# Patient Record
Sex: Male | Born: 1960 | Hispanic: Yes | Marital: Married | Smoking: Never smoker
Health system: Southern US, Community
[De-identification: ages and names within clinical notes are randomized; demographics above are authoritative.]

## PROBLEM LIST (undated history)

## (undated) DIAGNOSIS — I219 Acute myocardial infarction, unspecified: Secondary | ICD-10-CM

## (undated) DIAGNOSIS — Z8619 Personal history of other infectious and parasitic diseases: Secondary | ICD-10-CM

## (undated) DIAGNOSIS — T7840XA Allergy, unspecified, initial encounter: Secondary | ICD-10-CM

## (undated) DIAGNOSIS — I2510 Atherosclerotic heart disease of native coronary artery without angina pectoris: Secondary | ICD-10-CM

## (undated) DIAGNOSIS — J45909 Unspecified asthma, uncomplicated: Secondary | ICD-10-CM

## (undated) DIAGNOSIS — I519 Heart disease, unspecified: Secondary | ICD-10-CM

## (undated) DIAGNOSIS — G4730 Sleep apnea, unspecified: Secondary | ICD-10-CM

## (undated) DIAGNOSIS — I10 Essential (primary) hypertension: Secondary | ICD-10-CM

## (undated) DIAGNOSIS — R7303 Prediabetes: Secondary | ICD-10-CM

## (undated) HISTORY — DX: Prediabetes: R73.03

## (undated) HISTORY — DX: Sleep apnea, unspecified: G47.30

## (undated) HISTORY — DX: Essential (primary) hypertension: I10

## (undated) HISTORY — DX: Atherosclerotic heart disease of native coronary artery without angina pectoris: I25.10

## (undated) HISTORY — DX: Heart disease, unspecified: I51.9

## (undated) HISTORY — DX: Allergy, unspecified, initial encounter: T78.40XA

## (undated) HISTORY — DX: Unspecified asthma, uncomplicated: J45.909

## (undated) HISTORY — DX: Personal history of other infectious and parasitic diseases: Z86.19

---

## 2013-07-23 DIAGNOSIS — I219 Acute myocardial infarction, unspecified: Secondary | ICD-10-CM

## 2013-07-23 HISTORY — DX: Acute myocardial infarction, unspecified: I21.9

## 2013-08-06 HISTORY — PX: CORONARY ANGIOPLASTY WITH STENT PLACEMENT: SHX49

## 2014-06-29 ENCOUNTER — Encounter: Payer: Self-pay | Admitting: *Deleted

## 2014-06-29 ENCOUNTER — Emergency Department (HOSPITAL_BASED_OUTPATIENT_CLINIC_OR_DEPARTMENT_OTHER)
Admission: EM | Admit: 2014-06-29 | Discharge: 2014-06-29 | Disposition: A | Discharge status: Home / Self Care | Payer: Federal, State, Local not specified - PPO | Attending: Emergency Medicine | Admitting: Emergency Medicine

## 2014-06-29 ENCOUNTER — Emergency Department
Admission: EM | Admit: 2014-06-29 | Discharge: 2014-06-29 | Disposition: A | Discharge status: Other Acute Inpatient Hospital | Payer: BC Managed Care – PPO | Source: Home / Self Care

## 2014-06-29 ENCOUNTER — Encounter (HOSPITAL_BASED_OUTPATIENT_CLINIC_OR_DEPARTMENT_OTHER): Payer: Self-pay | Admitting: *Deleted

## 2014-06-29 ENCOUNTER — Other Ambulatory Visit: Payer: Self-pay

## 2014-06-29 ENCOUNTER — Emergency Department (HOSPITAL_BASED_OUTPATIENT_CLINIC_OR_DEPARTMENT_OTHER): Payer: Federal, State, Local not specified - PPO

## 2014-06-29 DIAGNOSIS — Z955 Presence of coronary angioplasty implant and graft: Secondary | ICD-10-CM | POA: Insufficient documentation

## 2014-06-29 DIAGNOSIS — Z7982 Long term (current) use of aspirin: Secondary | ICD-10-CM | POA: Diagnosis not present

## 2014-06-29 DIAGNOSIS — I252 Old myocardial infarction: Secondary | ICD-10-CM | POA: Diagnosis not present

## 2014-06-29 DIAGNOSIS — R079 Chest pain, unspecified: Secondary | ICD-10-CM

## 2014-06-29 DIAGNOSIS — Z79899 Other long term (current) drug therapy: Secondary | ICD-10-CM | POA: Diagnosis not present

## 2014-06-29 HISTORY — DX: Acute myocardial infarction, unspecified: I21.9

## 2014-06-29 LAB — COMPREHENSIVE METABOLIC PANEL
ALK PHOS: 83 U/L (ref 39–117)
ALT: 29 U/L (ref 0–53)
AST: 18 U/L (ref 0–37)
Albumin: 3.8 g/dL (ref 3.5–5.2)
Anion gap: 13 (ref 5–15)
BILIRUBIN TOTAL: 0.7 mg/dL (ref 0.3–1.2)
BUN: 19 mg/dL (ref 6–23)
CALCIUM: 9.2 mg/dL (ref 8.4–10.5)
CO2: 24 meq/L (ref 19–32)
Chloride: 105 mEq/L (ref 96–112)
Creatinine, Ser: 0.8 mg/dL (ref 0.50–1.35)
GFR calc non Af Amer: >90 mL/min (ref >90)
Glucose, Bld: 105 mg/dL — ABNORMAL HIGH (ref 70–99)
POTASSIUM: 4.3 meq/L (ref 3.7–5.3)
Sodium: 142 mEq/L (ref 137–147)
Total Protein: 7.8 g/dL (ref 6.0–8.3)

## 2014-06-29 LAB — CBC WITH DIFFERENTIAL/PLATELET
Basophils Absolute: 0 10*3/uL (ref 0.0–0.1)
Basophils Relative: 0 % (ref 0–1)
EOS PCT: 2 % (ref 0–5)
Eosinophils Absolute: 0.2 10*3/uL (ref 0.0–0.7)
HCT: 42 % (ref 39.0–52.0)
Hemoglobin: 14.1 g/dL (ref 13.0–17.0)
Lymphocytes Relative: 32 % (ref 12–46)
Lymphs Abs: 2.2 10*3/uL (ref 0.7–4.0)
MCH: 29.5 pg (ref 26.0–34.0)
MCHC: 33.6 g/dL (ref 30.0–36.0)
MCV: 87.9 fL (ref 78.0–100.0)
Monocytes Absolute: 0.8 10*3/uL (ref 0.1–1.0)
Monocytes Relative: 11 % (ref 3–12)
Neutro Abs: 3.9 10*3/uL (ref 1.7–7.7)
Neutrophils Relative %: 55 % (ref 43–77)
PLATELETS: 198 10*3/uL (ref 150–400)
RBC: 4.78 MIL/uL (ref 4.22–5.81)
RDW: 14.9 % (ref 11.5–15.5)
WBC: 7.1 10*3/uL (ref 4.0–10.5)

## 2014-06-29 LAB — TROPONIN I: Troponin I: <0.3 ng/mL (ref <0.30)

## 2014-06-29 NOTE — ED Provider Notes (Signed)
CSN: [id number]     Arrival date & time 06/29/14   History   First MD Initiated Contact with Patient 06/29/14 1121     Chief Complaint  Patient presents with  . Chest Pain   (Consider location/radiation/quality/duration/timing/severity/associated sxs/prior Treatment) HPI  Pt is a 53 yo male who presents to urgent care with right sided CP intermittently since 06/25/14. Seems to be worse at night. Previous MI 1/15 with stent placement. Moved here from [place] and has appt with cardiologist Dr. Eden Emms on 07/21/14. Denies fever, cough, hx of acid reflux, trauma, or injury. No heavy lifting or new activities.   Past Medical History  Diagnosis Date  . Myocardial infarction 07/2013   Past Surgical History  Procedure Laterality Date  . Coronary angioplasty with stent placement     Family History  Problem Relation Age of Onset  . Hypertension Mother    History  Substance Use Topics  . Smoking status: Never Smoker   . Smokeless tobacco: Not on file  . Alcohol Use: No    Review of Systems  All other systems reviewed and are negative.   Allergies  Review of patient's allergies indicates no known allergies.  Home Medications   Prior to Admission medications   Medication Sig Start Date End Date Taking? Authorizing Provider  aspirin 81 MG tablet Take 81 mg by mouth daily.    Historical Provider, MD  atorvastatin (LIPITOR) 80 MG tablet Take 80 mg by mouth daily.    Historical Provider, MD  carvedilol (COREG) 12.5 MG tablet Take 12.5 mg by mouth 2 (two) times daily with a meal.    Historical Provider, MD  digoxin (LANOXIN) 0.125 MG tablet Take by mouth as needed.    Historical Provider, MD  ticagrelor (BRILINTA) 90 MG TABS tablet Take by mouth 2 (two) times daily.    Historical Provider, MD   BP 118/81 mmHg  Pulse 58  Temp(Src) 99 F (37.2 C) (Oral)  Resp 16  Ht 5\' 9"  (1.753 m)  Wt 223 lb (101.152 kg)  BMI 32.92 kg/m2  SpO2 100% Physical Exam  Constitutional: He is oriented  to person, place, and time. He appears well-developed and well-nourished.  Cardiovascular: Normal rate, regular rhythm and normal heart sounds.   Pulmonary/Chest: Effort normal and breath sounds normal. He has no wheezes.  Neurological: He is alert and oriented to person, place, and time.  Skin: Skin is dry.  Psychiatric: He has a normal mood and affect. His behavior is normal.    ED Course  Procedures (including critical care time) Labs Review  Imaging Review    MDM   1. Chest pain   2. History of MI (myocardial infarction)    EKG was obtained. NSR at 64. No ST elevation noted. Did appear to be RBBB in v1. Rhythm lead t and p waves seemed to merge. No EKG to compare.  Pt appears stable today with good pulse and BP.  With prior hx I would like pt to have cardiac enzymes performed to exclude NSTEMI event. Pt agrees with plan and [facility] is [facility].     Jomarie Longs, PA-C 06/29/14 1457

## 2014-06-29 NOTE — ED Notes (Signed)
Pt c/o center to RT side CP intermittently x 06/25/14, worse at night. He reports having an MI 1/15' with stent placement. He recently moved from [place] and has an appt with a cardiologist Dr Eden Emms on 07/21/14.

## 2014-06-29 NOTE — ED Notes (Signed)
MD [name] at bedside discussing results with family and patient

## 2014-06-29 NOTE — Discharge Instructions (Signed)
Instructed patient to go to ER for further monitoring.

## 2014-06-29 NOTE — ED Notes (Signed)
Pt c/o right chest aching that began 5 days ago. Pain has been intermittent with no other symptoms. Pt had MI with stent placed in Jan., 2015 and sts that this pain is nothing like when he had the MI.

## 2014-06-29 NOTE — ED Provider Notes (Signed)
CSN: [id number]     Arrival date & time 06/29/14  1111 History   First MD Initiated Contact with Patient 06/29/14 1121     Chief Complaint  Patient presents with  . Chest Pain     Patient is a 53 y.o. male presenting with chest pain. The history is provided by the patient.  Chest Pain  Jared Morse presents for evaluation of right sided chest pain.  Pain is descsribed as a numbness feeling.  It is constant and nonradiating.  It has been present for three days.  It ranges from a 1-4 in intensity and does not change with eating, exertion, or breathing.  He denies fevers, cough, SOB, abdominal pain, vomiting, leg swelling or pain.  He has a hx/o MI in January and takes ASA, brilinta, atorvastatin, and carvedilol.  Sxs are different from his prior MI.  Sxs are mild, intermittent, and improving.    Past Medical History  Diagnosis Date  . Myocardial infarction 07/2013   Past Surgical History  Procedure Laterality Date  . Coronary angioplasty with stent placement     Family History  Problem Relation Age of Onset  . Hypertension Mother    History  Substance Use Topics  . Smoking status: Never Smoker   . Smokeless tobacco: Not on file  . Alcohol Use: No    Review of Systems  Cardiovascular: Positive for chest pain.  All other systems reviewed and are negative.     Allergies  Review of patient's allergies indicates no known allergies.  Home Medications   Prior to Admission medications   Medication Sig Start Date End Date Taking? Authorizing Provider  aspirin 81 MG tablet Take 81 mg by mouth daily.    Historical Provider, MD  atorvastatin (LIPITOR) 80 MG tablet Take 80 mg by mouth daily.    Historical Provider, MD  carvedilol (COREG) 12.5 MG tablet Take 12.5 mg by mouth 2 (two) times daily with a meal.    Historical Provider, MD  digoxin (LANOXIN) 0.125 MG tablet Take by mouth as needed.    Historical Provider, MD  ticagrelor (BRILINTA) 90 MG TABS tablet Take by mouth 2 (two) times  daily.    Historical Provider, MD   BP 134/79 mmHg  Pulse 59  Temp(Src) 99 F (37.2 C) (Oral)  Resp 23  Ht 5\' 9"  (1.753 m)  Wt 223 lb (101.152 kg)  BMI 32.92 kg/m2  SpO2 97% Physical Exam  Constitutional: He is oriented to person, place, and time. He appears well-developed and well-nourished.  HENT:  Head: Normocephalic and atraumatic.  Cardiovascular: Normal rate.   No murmur heard. Pulmonary/Chest: Effort normal. No respiratory distress.  Abdominal: Soft. There is no tenderness. There is no rebound and no guarding.  Musculoskeletal: He exhibits no edema or tenderness.  Neurological: He is alert and oriented to person, place, and time.  Skin: Skin is warm and dry.  Psychiatric: He has a normal mood and affect. His behavior is normal.  Nursing note and vitals reviewed.   ED Course  Procedures (including critical care time) Labs Review Labs Reviewed  COMPREHENSIVE METABOLIC PANEL - Abnormal; Notable for the following:    Glucose, Bld 105 (*)    All other components within normal limits  CBC WITH DIFFERENTIAL  TROPONIN I    Imaging Review Dg Chest 2 View  06/29/2014   CLINICAL DATA:  53 year old male with chest pain for 4 days. Initial encounter.  EXAM: CHEST  2 VIEW  COMPARISON:  None.  FINDINGS:  Normal lung volumes. Normal cardiac size and mediastinal contours. Visualized tracheal air column is within normal limits. No pneumothorax, pulmonary edema, pleural effusion or confluent pulmonary opacity. No osseous abnormality identified.  IMPRESSION: Negative, no acute cardiopulmonary abnormality.   Electronically Signed   By: Augusto GambleLee  Hall M.D.   On: 06/29/2014 12:05     EKG Interpretation None       Date: 06/29/2014  Rate: 59  Rhythm: Sinus Bradycardia  QRS Axis: normal  Intervals: normal  ST/T Wave abnormalities: normal  Conduction Disutrbances:incomplete RBBB  Narrative Interpretation:   Old EKG Reviewed: unchanged   MDM   Final diagnoses:  Chest pain  History  of MI (myocardial infarction)    Patient with history of an nonSTEMI back in January of this year in [place] and is here for evaluation of atypical chest pain. He's had chest pain has been present for the last 4 days that is better with exertion, and has no clear trigger. It was obtain EKG and records from [facility] and his EKG has no significant changes from prior. He had a heart cath that demonstrated an RCA lesion which he received a stent for any has mild to moderate LAD disease. Patient is pain-free in the emergency department. Discussed the case with cardiology on call in cardiology recommended offering [facility] for [facility] testing versus outpatient follow-up if pain is resolved and enzymes negative. Patient does not want to come in for stress testing and prefers outpatient follow-up. Discussed with patient calling to see if he can move up his outpatient appointments, continuing his current medications, and returning if he develops recurrent pain. Clinical picture is not consistent with PE, ACS, pneumonia.   Tilden FossaElizabeth Oneka Parada, MD 06/29/14 [phone]

## 2014-06-29 NOTE — ED Notes (Signed)
Records received from [place].

## 2014-06-29 NOTE — Discharge Instructions (Signed)

## 2014-07-20 NOTE — Progress Notes (Signed)
Patient ID: Jared Morse, male   DOB: 02/27/1961, 53 y.o.   MRN: [id number]   53 yo with MI in [place] end of January.  Reviewed cath 08/04/13  Had 90 % proximal RCA lesion after RV branch LAD 50-60% mid after large D1  LV gram inferior MI EF 55%   TC 67  HDL 28  LDL 18  Echo 08/26/13 read as EF 40%  Mild AR no MR   Seen in ER two weeks ago with [facility] R/O thought heart ok ? Melena with anemia started on iron Needs f/u with GI for endo.    Reviewed over 40 pages of records from [facility] including office reports, echo, and cath   Works with [employer]     ROS: Denies fever, malais, weight loss, blurry vision, decreased visual acuity, cough, sputum, SOB, hemoptysis, pleuritic pain, palpitaitons, heartburn, abdominal pain, melena, lower extremity edema, claudication, or rash.  All other systems reviewed and negative   General: Affect appropriate Healthy:  appears stated age HEENT: normal Neck supple with no adenopathy JVP normal no bruits no thyromegaly Lungs clear with no wheezing and good diaphragmatic motion Heart:  S1/S2 no murmur,rub, gallop or click PMI normal Abdomen: benighn, BS positve, no tenderness, no AAA no bruit.  No HSM or HJR Distal pulses intact with no bruits No edema Neuro non-focal Skin warm and dry No muscular weakness  Medications Current Outpatient Prescriptions  Medication Sig Dispense Refill  . aspirin 81 MG tablet Take 81 mg by mouth daily.    Marland Kitchen atorvastatin (LIPITOR) 80 MG tablet Take 80 mg by mouth daily.    . carvedilol (COREG) 12.5 MG tablet Take 12.5 mg by mouth 2 (two) times daily with a meal.    . lisinopril (PRINIVIL,ZESTRIL) 5 MG tablet Take 5 mg by mouth daily. Patient taking 2.5mg  daily.    . ticagrelor (BRILINTA) 90 MG TABS tablet Take by mouth 2 (two) times daily.     No current facility-administered medications for this visit.    Allergies Review of patient's allergies indicates no known  allergies.  Family History: Family History  Problem Relation Age of Onset  . Hypertension Mother     Social History: History   Social History  . Marital Status: Married    Spouse Name: N/A    Number of Children: N/A  . Years of Education: N/A   Occupational History  . Not on file.   Social History Main Topics  . Smoking status: Never Smoker   . Smokeless tobacco: Not on file  . Alcohol Use: No  . Drug Use: No  . Sexual Activity: Not on file   Other Topics Concern  . Not on file   Social History Narrative    Past Surgical History  Procedure Laterality Date  . Coronary angioplasty with stent placement      Past Medical History  Diagnosis Date  . Myocardial infarction 07/2013    Electrocardiogram:  SR rate 59  ICRBBB  12/15  08/20/13 SR RBBB IMI   Today SR rate 64 old IMI QRS 92 ICRBBB  Assessment and Plan

## 2014-07-21 ENCOUNTER — Ambulatory Visit (INDEPENDENT_AMBULATORY_CARE_PROVIDER_SITE_OTHER): Payer: Federal, State, Local not specified - PPO | Admitting: Cardiovascular Disease

## 2014-07-21 ENCOUNTER — Encounter: Payer: Self-pay | Admitting: Cardiovascular Disease

## 2014-07-21 VITALS — BP 122/68 | HR 64 | Ht 69.0 in | Wt 222.1 lb

## 2014-07-21 DIAGNOSIS — R931 Abnormal findings on diagnostic imaging of heart and coronary circulation: Secondary | ICD-10-CM

## 2014-07-21 DIAGNOSIS — R9431 Abnormal electrocardiogram [ECG] [EKG]: Secondary | ICD-10-CM

## 2014-07-21 DIAGNOSIS — I2510 Atherosclerotic heart disease of native coronary artery without angina pectoris: Secondary | ICD-10-CM

## 2014-07-21 DIAGNOSIS — K921 Melena: Secondary | ICD-10-CM

## 2014-07-21 DIAGNOSIS — I209 Angina pectoris, unspecified: Secondary | ICD-10-CM

## 2014-07-21 DIAGNOSIS — I2583 Coronary atherosclerosis due to lipid rich plaque: Principal | ICD-10-CM

## 2014-07-21 NOTE — Assessment & Plan Note (Signed)
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

## 2014-07-21 NOTE — Patient Instructions (Addendum)
Your physician recommends that you schedule a follow-up appointment in:  3 MONTHS WITH  DR  [facility] Your physician has recommended you make the following change in your medication:  STOP  [facility]  Your physician has requested that you have an echocardiogram. Echocardiography is a painless test that uses sound waves to create images of your heart. It provides your doctor with information about the size and shape of your heart and how well your heart's chambers and valves are working. This procedure takes approximately one hour. There are no restrictions for this procedure.  Your physician has requested that you have en exercise stress myoview. For further information please visit [email or web address] Please follow instruction sheet, as given.     [place]   GI MD  DR    Vida Rigger OR DR  Francoise Schaumann [phone]

## 2014-07-21 NOTE — Assessment & Plan Note (Signed)
Has not been relooked at since 2/15  F/u echo no Murmur  Unusual to have remodeling with this low EF and no MR  Echo report did not even mention discrete RWMA

## 2014-07-21 NOTE — Assessment & Plan Note (Signed)
Recent ER visit with [facility]  R/O ECG ok  With ICRBBB and old IMI  F/u stress myovue with known moderate LAD disease Will stop Brillinita had DES a year ago and ? melena

## 2014-07-21 NOTE — Assessment & Plan Note (Signed)
Dark stools subsided  On iron and PPI  Gave him name of Dr Ewing Schlein and Dr Matthias Hughs for referral endo.  DC Brillinta

## 2014-07-26 ENCOUNTER — Ambulatory Visit (HOSPITAL_COMMUNITY): Payer: Federal, State, Local not specified - PPO | Attending: Cardiology

## 2014-07-26 DIAGNOSIS — I2510 Atherosclerotic heart disease of native coronary artery without angina pectoris: Secondary | ICD-10-CM

## 2014-07-26 DIAGNOSIS — I2583 Coronary atherosclerosis due to lipid rich plaque: Secondary | ICD-10-CM

## 2014-07-26 DIAGNOSIS — R931 Abnormal findings on diagnostic imaging of heart and coronary circulation: Secondary | ICD-10-CM

## 2014-07-26 NOTE — Progress Notes (Signed)
2D Echo completed. 07/26/2014 

## 2014-07-27 ENCOUNTER — Ambulatory Visit (HOSPITAL_COMMUNITY): Payer: Federal, State, Local not specified - PPO | Attending: Cardiovascular Disease | Admitting: Radiology

## 2014-07-27 DIAGNOSIS — I4510 Unspecified right bundle-branch block: Secondary | ICD-10-CM | POA: Diagnosis not present

## 2014-07-27 DIAGNOSIS — I2510 Atherosclerotic heart disease of native coronary artery without angina pectoris: Secondary | ICD-10-CM | POA: Diagnosis not present

## 2014-07-27 DIAGNOSIS — I2583 Coronary atherosclerosis due to lipid rich plaque: Secondary | ICD-10-CM

## 2014-07-27 DIAGNOSIS — I10 Essential (primary) hypertension: Secondary | ICD-10-CM | POA: Insufficient documentation

## 2014-07-27 DIAGNOSIS — R931 Abnormal findings on diagnostic imaging of heart and coronary circulation: Secondary | ICD-10-CM

## 2014-07-27 DIAGNOSIS — R079 Chest pain, unspecified: Secondary | ICD-10-CM | POA: Diagnosis not present

## 2014-07-27 DIAGNOSIS — R9431 Abnormal electrocardiogram [ECG] [EKG]: Secondary | ICD-10-CM

## 2014-07-27 DIAGNOSIS — I209 Angina pectoris, unspecified: Secondary | ICD-10-CM

## 2014-07-27 MED ORDER — TECHNETIUM TC 99M SESTAMIBI GENERIC - CARDIOLITE
10.0000 | Freq: Once | INTRAVENOUS | Status: AC | PRN
  Administered 2014-07-27: 10 via INTRAVENOUS

## 2014-07-27 MED ORDER — TECHNETIUM TC 99M SESTAMIBI GENERIC - CARDIOLITE
30.0000 | Freq: Once | INTRAVENOUS | Status: AC | PRN
  Administered 2014-07-27: 30 via INTRAVENOUS

## 2014-07-27 NOTE — Progress Notes (Signed)
[facility] SITE 3 NUCLEAR MED [address] [place], [place] [address] [phone]    Cardiology Nuclear Med Study  Jared Morse is a 54 y.o. male     MRN : [id number]     DOB: 1960/09/29  Procedure Date: 07/27/2014  Nuclear Med Background Indication for Stress Test:  Evaluation for Ischemia, Stent Patency and Abnormal EKG History:  CAD-Stent, 06/29/14 ED R/O MI CP Cardiac Risk Factors: Hypertension and IRBBB  Symptoms:  Chest Pain   Nuclear Pre-Procedure Caffeine/Decaff Intake:  None> 12 hrs NPO After: 5:00pm   Lungs:  clear O2 Sat: 97% on room air. IV 0.9% NS with Angio Cath:  22g  IV Site: R Hand x 1, tolerated well IV Started by:  Irean Hong, RN  Chest Size (in):  48 Cup Size: n/a  Height: 5\' 9"  (1.753 m)  Weight:  225 lb (102.059 kg)  BMI:  Body mass index is 33.21 kg/(m^2). Tech Comments:  Patient held Coreg x 24 hrs. Irean Hong, RN.    Nuclear Med Study 1 or 2 day study: 1 day  Stress Test Type:  Stress  Reading MD: N/A  Order Authorizing Provider:  Charlton Haws, MD  Resting Radionuclide: Technetium 60m Sestamibi  Resting Radionuclide Dose: 11.0 mCi   Stress Radionuclide:  Technetium 74m Sestamibi  Stress Radionuclide Dose: 33.0 mCi           Stress Protocol Rest HR: 56 Stress HR: 166  Rest BP: 115/65 Stress BP: 171/63  Exercise Time (min): 10:30 METS: 12.5   Predicted Max HR: 168 bpm % Max HR: 98.81 bpm Rate Pressure Product: 00923   Dose of Adenosine (mg):  n/a Dose of Lexiscan: n/a mg  Dose of Atropine (mg): n/a Dose of Dobutamine: n/a mcg/kg/min (at max HR)  Stress Test Technologist: Milana Na, EMT-P  Nuclear Technologist:  Kerby Nora, CNMT     Rest Procedure:  Myocardial perfusion imaging was performed at rest 45 minutes following the intravenous administration of Technetium 100m Sestamibi. Rest ECG: NSR - Normal EKG  Stress Procedure:  The patient exercised on the treadmill utilizing the Bruce Protocol for 10:30 minutes.  The patient stopped due to fatigue and denied any chest pain.  Technetium 56m Sestamibi was injected at peak exercise and myocardial perfusion imaging was performed after a brief delay. Stress ECG: No significant ST segment change suggestive of ischemia.  QPS Raw Data Images:  Normal; no motion artifact; normal heart/lung ratio. Stress Images:  Patchy, non-specific defects Rest Images:  Patchy, non-specific defects Subtraction (SDS):  No evidence of ischemia. Transient Ischemic Dilatation (Normal <1.22):  0.86 Lung/Heart Ratio (Normal <0.45):  0.29  Quantitative Gated Spect Images QGS EDV:  141 ml QGS ESV:  72 ml  Impression Exercise Capacity:  Excellent exercise capacity. BP Response:  Normal blood pressure response. Clinical Symptoms:  No significant symptoms noted. ECG Impression:  No significant ST segment change suggestive of ischemia. Comparison with Prior Nuclear Study: No previous nuclear study performed  Overall Impression:  Low risk stress nuclear study without significant perfusion defects and mildly reduced LV systolic function.  LV Ejection Fraction: 49%.  LV Wall Motion:  Normal Wall Motion   Chrystie Nose, MD, [facility] Board Certified in Nuclear Cardiology Attending Cardiologist [facility]

## 2014-08-02 ENCOUNTER — Telehealth: Payer: Self-pay | Admitting: Cardiovascular Disease

## 2014-08-02 NOTE — Telephone Encounter (Signed)
Notified of echo and stress test results. 

## 2014-08-02 NOTE — Telephone Encounter (Signed)
New message      Want stress test/echo  results

## 2015-01-12 ENCOUNTER — Encounter: Payer: Self-pay | Admitting: Family Medicine

## 2015-01-12 ENCOUNTER — Ambulatory Visit (INDEPENDENT_AMBULATORY_CARE_PROVIDER_SITE_OTHER): Payer: Federal, State, Local not specified - PPO | Admitting: Family Medicine

## 2015-01-12 VITALS — BP 130/74 | HR 74 | Ht 69.0 in | Wt 240.0 lb

## 2015-01-12 DIAGNOSIS — M9902 Segmental and somatic dysfunction of thoracic region: Secondary | ICD-10-CM | POA: Diagnosis not present

## 2015-01-12 DIAGNOSIS — M9904 Segmental and somatic dysfunction of sacral region: Secondary | ICD-10-CM | POA: Diagnosis not present

## 2015-01-12 DIAGNOSIS — M999 Biomechanical lesion, unspecified: Secondary | ICD-10-CM

## 2015-01-12 DIAGNOSIS — M549 Dorsalgia, unspecified: Secondary | ICD-10-CM | POA: Insufficient documentation

## 2015-01-12 DIAGNOSIS — M9903 Segmental and somatic dysfunction of lumbar region: Secondary | ICD-10-CM

## 2015-01-12 DIAGNOSIS — M545 Low back pain, unspecified: Secondary | ICD-10-CM

## 2015-01-12 NOTE — Assessment & Plan Note (Signed)
Decision today to treat with OMT was based on Physical Exam  After verbal consent patient was treated with HVLA, ME techniques in her acetabulum all lumbar and sacral areas  Patient tolerated the procedure well with improvement in symptoms  Patient given exercises, stretches and lifestyle modifications  See medications in patient instructions if given  Patient will follow up in 3 weeks

## 2015-01-12 NOTE — Progress Notes (Signed)
Pre visit review using our clinic review tool, if applicable. No additional management support is needed unless otherwise documented below in the visit note. 

## 2015-01-12 NOTE — Progress Notes (Signed)
[name] [address] [place], [place] [address] Phone: [phone] Subjective:    I'm seeing this patient by the request  of:  [place], Timoteo Expose, MD   CC: Back pain  [id number] Traeson Ceci is a 54 y.o. male coming in with complaint of back pain. Patient has had back pain for multiple years. Patient states 5 years ago he did have a flare of back pain and was diagnosed with a bulging disc at L5-S1. Patient did respond very well to conservative therapy including formal physical therapy. Since then he had been doing relatively well. Over the course last 3 weeks though the pain is starting to increase again. Patient started having radiation going down the right leg occasionally but it seems to be significantly better. Patient has been doing the exercises and taking anti-inflammatory's with some mild/moderate benefit. Patient states the pain is a proximally 6 out of 10 in severity. Not stopping him from any activities but he would like to be able to go back to the gym when she has not been doing on a regular basis due to concern that he would irritate the area. Denies any weakness of the lower extremities. Denies any nighttime awakening secondary to pain. Denies any fever, chills, or any abnormal weight loss.  Past Medical History  Diagnosis Date  . Myocardial infarction 07/2013  . Coronary artery disease     consistent with angina/ NON STEMI (non ST elevated myocardial infaraction)  . CAD (coronary artery disease)     EF = 0.40%,mild AR,RVSP=48mmHG  ECHO 08/26/13  . Heart disease    Past Surgical History  Procedure Laterality Date  . Coronary angioplasty with stent placement  08/06/13    DES to RCA in setting of NSTEMI, 50-60 %LAD   History  Substance Use Topics  . Smoking status: Never Smoker   . Smokeless tobacco: Not on file  . Alcohol Use: No   No Known Allergies Family History  Problem Relation Age of Onset  . Hypertension Mother          Past medical history, social, surgical and family history all reviewed in electronic medical record.   Review of Systems: No headache, visual changes, nausea, vomiting, diarrhea, constipation, dizziness, abdominal pain, skin rash, fevers, chills, night sweats, weight loss, swollen lymph nodes, body aches, joint swelling, muscle aches, chest pain, shortness of breath, mood changes.   Objective Blood pressure 130/74, pulse 74, height 5\' 9"  (1.753 m), weight 240 lb (108.863 kg), SpO2 95 %.  General: No apparent distress alert and oriented x3 mood and affect normal, dressed appropriately.  HEENT: Pupils equal, extraocular movements intact  Respiratory: Patient's speak in full sentences and does not appear short of breath  Cardiovascular: No lower extremity edema, non tender, no erythema  Skin: Warm dry intact with no signs of infection or rash on extremities or on axial skeleton.  Abdomen: Soft nontender  Neuro: Cranial nerves II through XII are intact, neurovascularly intact in all extremities with 2+ DTRs and 2+ pulses.  Lymph: No lymphadenopathy of posterior or anterior cervical chain or axillae bilaterally.  Gait normal with good balance and coordination.  MSK:  Non tender with full range of motion and good stability and symmetric strength and tone of shoulders, elbows, wrist, hip, knee and ankles bilaterally.  Back Exam:  Inspection: Unremarkable  Motion: Flexion 45 deg, Extension 45 deg, Side Bending to 45 deg bilaterally,  Rotation to 45 deg bilaterally  SLR laying: Negative  XSLR laying: Negative  Palpable tenderness: Moderate tenderness to palpation of the paraspinal musculature over L4-L5 bilaterally. Mild sacroiliac joint discomfort as well. FABER: negative. Sensory change: Gross sensation intact to all lumbar and sacral dermatomes.  Reflexes: 2+ at both patellar tendons, 2+ at achilles tendons, Babinski's downgoing.  Strength at foot  Plantar-flexion: 5/5 Dorsi-flexion: 5/5  Eversion: 5/5 Inversion: 5/5  Leg strength  Quad: 5/5 Hamstring: 5/5 Hip flexor: 5/5 Hip abductors: 4/5  Gait unremarkable.  Osteopathic findings Thoracic T5 extended rotated and side bent right T7 extended rotated and side bent left Lumbar L2 flexed rotated and side bent right Sacrum Left on left  Procedure note 97110; 15 minutes spent for Therapeutic exercises as stated in above notes.  This included exercises focusing on stretching, strengthening, with significant focus on eccentric aspects.   Low back exercises that included:  Pelvic tilt/bracing instruction to focus on control of the pelvic girdle and lower abdominal muscles  Glute strengthening exercises, focusing on proper firing of the glutes without engaging the low back muscles Proper stretching techniques for maximum relief for the hamstrings, hip flexors, low back and some rotation where tolerated  Proper technique shown and discussed handout in great detail with ATC.  All questions were discussed and answered.     Impression and Recommendations:     This case required medical decision making of moderate complexity.

## 2015-01-12 NOTE — Assessment & Plan Note (Signed)
Do believe the patient's pain is more secondary to a herniated disc previously but now it is more muscle imbalances. Patient given home exercises and work with Event organiser today. We discussed icing regimen and patient did respond well to osteopathic manipulation. We discussed the importance of hip abductor strengthening as well as core strengthening. Patient will start increasing his activity as again. Patient continues to have pain we may need to consider x-rays but do not think that there is necessary at this time. Patient is having no radicular symptoms either. We discussed over-the-counter medications a could be helpful as well. Patient come back in 3 weeks for further evaluation and treatment.

## 2015-01-12 NOTE — Patient Instructions (Signed)
Good to see you and welcome Ice 20 minutes 2 times daily. Usually after activity and before bed. Exercises 3 times a week.  Duexis 3 times a day for 6 days Gym starting Monday and 50% duration and 50% weight increase one or the other by 25% a week See me again in 3 weeks

## 2015-01-26 ENCOUNTER — Telehealth: Payer: Self-pay | Admitting: Family Medicine

## 2015-01-26 MED ORDER — IBUPROFEN-FAMOTIDINE 800-26.6 MG PO TABS: ORAL_TABLET | ORAL | Status: DC

## 2015-01-26 NOTE — Telephone Encounter (Signed)
Spoke to pt, rx for duexis sent into pharmacy.

## 2015-01-26 NOTE — Telephone Encounter (Signed)
Patient need a refill of the samples you gave him, he don't know the name of it.

## 2015-02-02 ENCOUNTER — Ambulatory Visit: Payer: Federal, State, Local not specified - PPO | Admitting: Family Medicine

## 2016-02-13 ENCOUNTER — Telehealth: Payer: Self-pay | Admitting: Emergency Medicine

## 2016-02-13 NOTE — Telephone Encounter (Signed)
Spoke to pt, explained to him we have not seen him in over a year. Pt scheduled OV & will refill med when he comes in.

## 2016-02-13 NOTE — Telephone Encounter (Signed)
Pt called and needs a refill on Ibuprofen-Famotidine 800-26.6 MG TABS. Pharmacy is [facility] in [place]. Please follow up thanks.

## 2016-02-20 ENCOUNTER — Ambulatory Visit (INDEPENDENT_AMBULATORY_CARE_PROVIDER_SITE_OTHER): Payer: Federal, State, Local not specified - PPO | Admitting: Family Medicine

## 2016-02-20 ENCOUNTER — Encounter: Payer: Self-pay | Admitting: Family Medicine

## 2016-02-20 DIAGNOSIS — M545 Low back pain, unspecified: Secondary | ICD-10-CM

## 2016-02-20 MED ORDER — IBUPROFEN-FAMOTIDINE 800-26.6 MG PO TABS: ORAL_TABLET | ORAL | 1 refills | Status: DC

## 2016-02-20 NOTE — Patient Instructions (Signed)
Good to see you  Ice 20 minutes 2 times daily. Usually after activity and before bed. Duexis 3 times daily for 3 days then as needed Roll up towel beneath legs with sitting.  Tennis ball between shoulder blades.  Try to do exercises 2-3 times a week.  [employer] orthotics "total support" online would be great  Vitamin D 2000 IU daily  Turmeric 500mg  daily  See me when you need me.

## 2016-02-20 NOTE — Progress Notes (Signed)
[name] [address] [place], [place] [address] Phone: [phone] Subjective:    I'm seeing this patient by the request  of:  Betty Swaziland, MD   CC: Back pain f/u   NOT:[id number]  Jared Morse is a 55 y.o. male coming in with complaint of back pain. Patient has had back pain for multiple years. Patient states 5 years ago he did have a flare of back pain and was diagnosed with a bulging disc at L5-S1. Patient did respond very well to conservative therapy including formal physical therapy. Patient was seen previously and did have have home exercises given to him. Patient has not been seen for a year. Patient states he is been doing very well. Has responded very well to the do axis. Patient knows he is not supposed to take it on a regular basis secondary to his heart conditions states that the only thing that seems to be beneficial. Patient understands and wants to continue the medication. Patient states otherwise his back is been doing relatively better. Not having the radicular symptoms he was having previously. Not doing exercises regularly and noted that can be helpful.   Past Medical History:  Diagnosis Date  . CAD (coronary artery disease)    EF = 0.40%,mild AR,RVSP=52mmHG  ECHO 08/26/13  . Coronary artery disease    consistent with angina/ NON STEMI (non ST elevated myocardial infaraction)  . Heart disease   . Myocardial infarction [facility]) 07/2013   Past Surgical History:  Procedure Laterality Date  . CORONARY ANGIOPLASTY WITH STENT PLACEMENT  08/06/13   DES to RCA in setting of NSTEMI, 50-60 %LAD   Social History  Substance Use Topics  . Smoking status: Never Smoker  . Smokeless tobacco: Not on file  . Alcohol use No   No Known Allergies Family History  Problem Relation Age of Onset  . Hypertension Mother         Past medical history, social, surgical and family history all reviewed in electronic medical record.   Review of Systems:  No headache, visual changes, nausea, vomiting, diarrhea, constipation, dizziness, abdominal pain, skin rash, fevers, chills, night sweats, weight loss, swollen lymph nodes, body aches, joint swelling, muscle aches, chest pain, shortness of breath, mood changes.   Objective  There were no vitals taken for this visit.  General: No apparent distress alert and oriented x3 mood and affect normal, dressed appropriately.  HEENT: Pupils equal, extraocular movements intact  Respiratory: Patient's speak in full sentences and does not appear short of breath  Cardiovascular: No lower extremity edema, non tender, no erythema  Skin: Warm dry intact with no signs of infection or rash on extremities or on axial skeleton.  Abdomen: Soft nontender  Neuro: Cranial nerves II through XII are intact, neurovascularly intact in all extremities with 2+ DTRs and 2+ pulses.  Lymph: No lymphadenopathy of posterior or anterior cervical chain or axillae bilaterally.  Gait normal with good balance and coordination.  MSK:  Non tender with full range of motion and good stability and symmetric strength and tone of shoulders, elbows, wrist, hip, knee and ankles bilaterally.  Back Exam:  Inspection: Unremarkable  Motion: Flexion 45 deg, Extension 25 deg, Side Bending to 45 deg bilaterally,  Rotation to 45 deg bilaterally  SLR laying: Negative  XSLR laying: Negative  Palpable tenderness: very mild tenderness of the L4-L5 on the right side paraspinal musculature FABER: negative. Sensory change: Gross sensation intact to all lumbar and sacral dermatomes.  Reflexes: 2+ at both patellar tendons, 2+ at achilles tendons, Babinski's downgoing.  Strength at foot  Plantar-flexion: 5/5 Dorsi-flexion: 5/5 Eversion: 5/5 Inversion: 5/5  Leg strength  Quad: 5/5 Hamstring: 5/5 Hip flexor: 5/5 Hip abductors: 4/5  Gait unremarkable.      Impression and Recommendations:     This case required medical decision making of moderate  complexity.

## 2016-02-20 NOTE — Assessment & Plan Note (Signed)
Discussed with patient at great length. Patient has gained significant amount weight. We discussed how this wouldn't affect his cardiovascular disease as well as the back pain. Patient is encouraged to start losing weight. We discussed more non-weightbearing exercises a could be beneficial. We refilled patient's ibuprofen but we warned him of potential side effects. Patient understands the risk and wants to continue the medication. Patient will monitor for any type of bowel or bladder changes or any's type of chest pain. Patient will continue the home exercises in the icing protocol. We discussed proper shoes and given different suggestions of over-the-counter medications. Patient will follow-up with me again in 4 weeks if further changes in her necessary. Patient has declined formal physical therapy recently.  Spent  25 minutes with patient face-to-face and had greater than 50% of counseling including as described above in assessment and plan.

## 2016-03-08 ENCOUNTER — Encounter: Payer: Self-pay | Admitting: Cardiovascular Disease

## 2016-03-20 ENCOUNTER — Encounter: Payer: Self-pay | Admitting: Cardiovascular Disease

## 2016-03-22 ENCOUNTER — Encounter: Payer: Self-pay | Admitting: Cardiovascular Disease

## 2016-04-04 ENCOUNTER — Ambulatory Visit: Payer: Federal, State, Local not specified - PPO | Admitting: Cardiovascular Disease

## 2016-04-04 ENCOUNTER — Telehealth: Payer: Self-pay | Admitting: *Deleted

## 2016-04-04 NOTE — Telephone Encounter (Signed)
LVM about todays appt, told pt they needed to call to reschedule or see when they were to come.

## 2016-05-10 NOTE — Progress Notes (Signed)
Patient ID: Jared Morse, male   DOB: Jun 24, 1961, 55 y.o.   MRN: [id number]   55 y.o.  with MI in [place] end of January 2015 .  Reviewed cath 08/04/13  Had 90 % proximal RCA lesion after RV branch LAD 50-60% mid after large D1  LV gram inferior MI EF 55%   TC 67  HDL 28  LDL 18  Echo 08/26/13 read as EF 40%  Mild AR no MR   Seen in ER 06/2014 with [facility] R/O thought heart ok ? 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  All other systems reviewed and negative   General: Affect appropriate Healthy:  appears stated age HEENT: normal Neck supple with no adenopathy JVP normal no bruits no thyromegaly Lungs clear with no wheezing and good diaphragmatic motion Heart:  S1/S2 no murmur,rub, gallop or click PMI normal Abdomen: benighn, BS positve, no tenderness, no AAA no bruit.  No HSM or HJR Distal pulses intact with no bruits No edema Neuro non-focal Skin warm and dry No muscular weakness  Medications Current Outpatient Prescriptions  Medication Sig Dispense Refill  . aspirin 81 MG tablet Take 81 mg by mouth daily.    Marland Kitchen atorvastatin (LIPITOR) 80 MG tablet Take 1 tablet (80 mg total) by mouth daily. 90 tablet 3  . carvedilol (COREG) 12.5 MG tablet Take 1 tablet (12.5 mg total) by mouth 2 (two) times daily with a meal. 180 tablet 3  .  Ibuprofen-Famotidine 800-26.6 MG TABS Take 1 tablet three times daily as needed. 270 tablet 1   No current facility-administered medications for this visit.     Allergies Review of patient's allergies indicates no known allergies.  Family History: Family History  Problem Relation Age of Onset  . Hypertension Mother     Social History: Social History   Social History  . Marital status: Married    Spouse name: N/A  . Number of children: N/A  . Years of education: N/A   Occupational History  . Not on file.   Social History Main Topics  . Smoking status: Never Smoker  . Smokeless tobacco: Never Used  . Alcohol use No  . Drug use: No  . Sexual activity: Not on file   Other Topics Concern  . Not on file   Social History Narrative  . No narrative on file    Past Surgical History:  Procedure Laterality Date  . CORONARY ANGIOPLASTY WITH STENT PLACEMENT  08/06/13   DES to RCA in setting of NSTEMI, 50-60 %LAD    Past Medical History:  Diagnosis Date  . CAD (coronary artery disease)    EF = 0.40%,mild AR,RVSP=47mmHG  ECHO 08/26/13  . Coronary artery disease    consistent with angina/ NON STEMI (non ST elevated myocardial infaraction)  . Heart disease   . Myocardial infarction 07/2013    Electrocardiogram:  SR  rate 59  ICRBBB  12/15  08/20/13 SR RBBB IMI    07/20/14 SR rate 64 old IMI QRS 92 ICRBBB 05/11/16  SR LAD RBBB Old IMI  Assessment and Plan  CAD: stable no angina continue ASA and beta blocker normal stress test 2016 Cholesterol  On statin labs with primary  Anemia stable labs with primar y MR mild no murmur on exam Fatigue:  Doubt cardiac etiology to get testosterone level checked but told him didn't Think this was issue Life style with frequent travel, lack of exercise weight gain and  Poor sleep habits more likely etiology    Jared HawsPeter Jared Morse Morse

## 2016-05-11 ENCOUNTER — Encounter (INDEPENDENT_AMBULATORY_CARE_PROVIDER_SITE_OTHER): Payer: Self-pay

## 2016-05-11 ENCOUNTER — Encounter: Payer: Self-pay | Admitting: Cardiovascular Disease

## 2016-05-11 ENCOUNTER — Ambulatory Visit (INDEPENDENT_AMBULATORY_CARE_PROVIDER_SITE_OTHER): Payer: Federal, State, Local not specified - PPO | Admitting: Cardiovascular Disease

## 2016-05-11 VITALS — BP 160/72 | HR 65 | Ht 69.0 in | Wt 256.4 lb

## 2016-05-11 DIAGNOSIS — I2510 Atherosclerotic heart disease of native coronary artery without angina pectoris: Secondary | ICD-10-CM

## 2016-05-11 DIAGNOSIS — I2583 Coronary atherosclerosis due to lipid rich plaque: Secondary | ICD-10-CM | POA: Diagnosis not present

## 2016-05-11 MED ORDER — ATORVASTATIN CALCIUM 80 MG PO TABS: 80.0000 mg | ORAL_TABLET | Freq: Every day | ORAL | 3 refills | Status: DC

## 2016-05-11 MED ORDER — CARVEDILOL 12.5 MG PO TABS: 12.5000 mg | ORAL_TABLET | Freq: Two times a day (BID) | ORAL | 3 refills | Status: DC

## 2016-05-11 NOTE — Patient Instructions (Addendum)

## 2016-06-29 IMAGING — CR DG CHEST 2V
2 series · 2 of 2 positions shown · non-contrast
Comparison: None.

CLINICAL DATA: 53-year-old male with chest pain for 4 days. Initial
encounter.

EXAM:
CHEST  2 VIEW

[w chest pa]
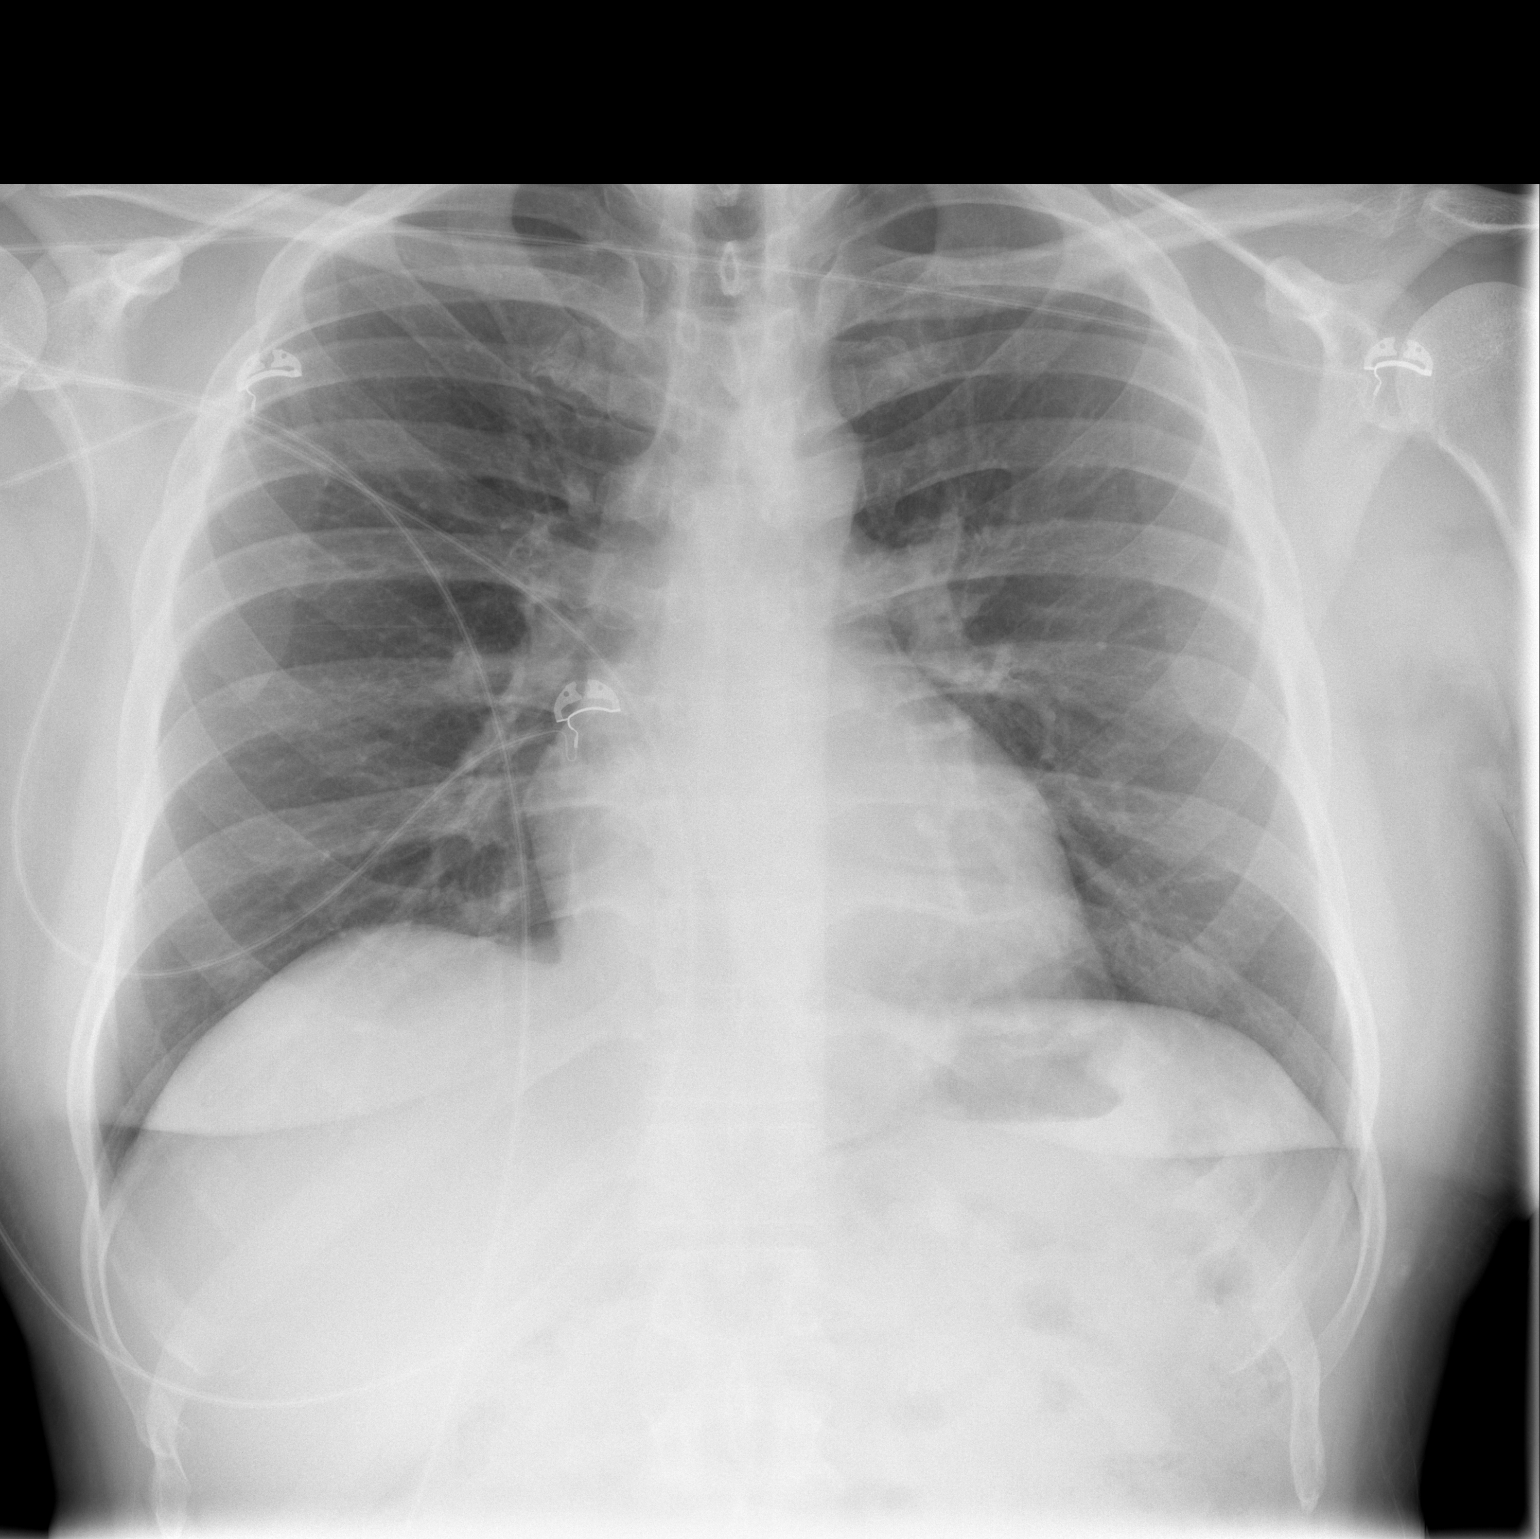

[w chest lat]
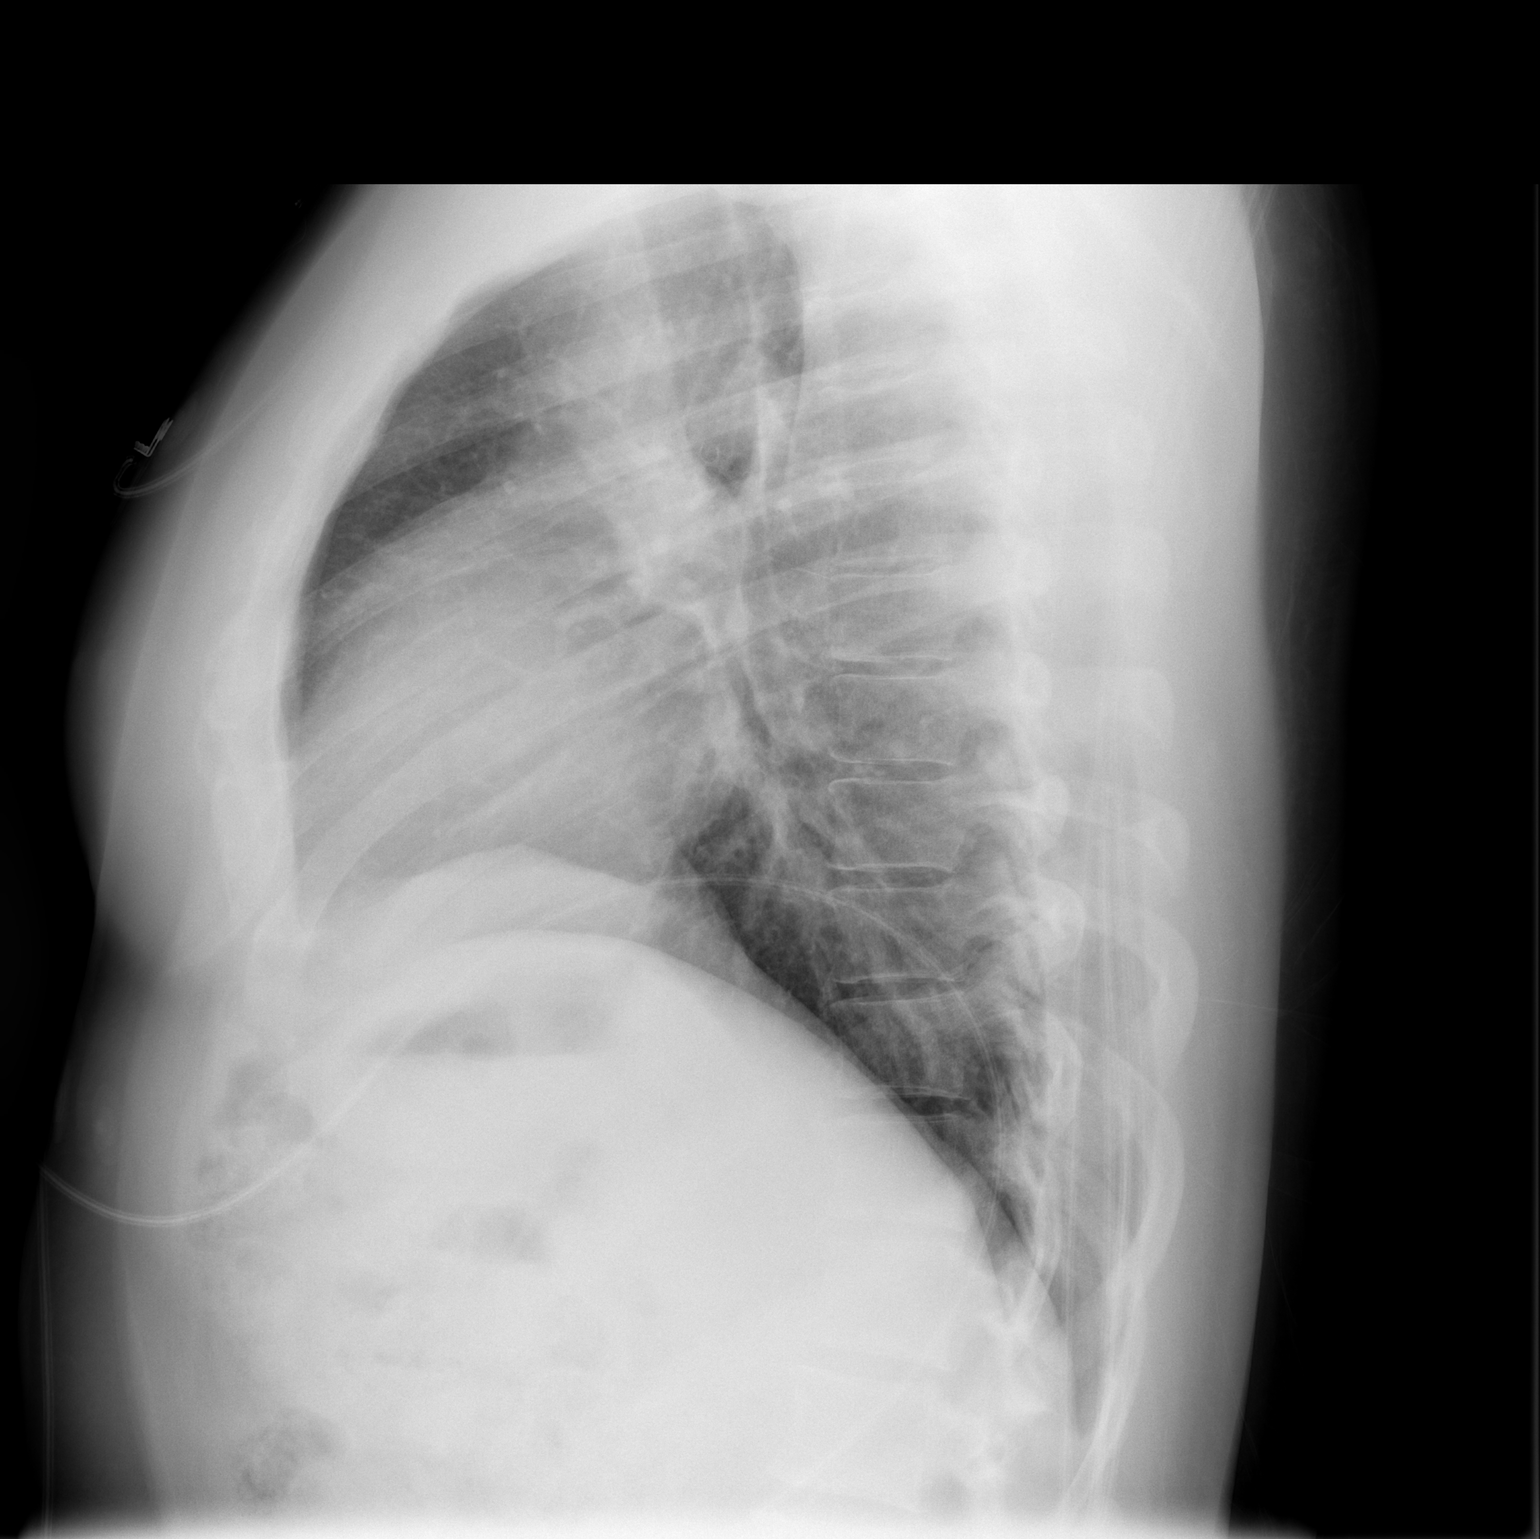

[2 of 2 positions shown; findings below may reference images not displayed]

FINDINGS: Normal lung volumes. Normal cardiac size and mediastinal contours.
Visualized tracheal air column is within normal limits. No
pneumothorax, pulmonary edema, pleural effusion or confluent
pulmonary opacity. No osseous abnormality identified.
IMPRESSION: Negative, no acute cardiopulmonary abnormality.

## 2016-07-04 ENCOUNTER — Other Ambulatory Visit: Payer: Self-pay | Admitting: Cardiovascular Disease

## 2016-07-04 NOTE — Telephone Encounter (Signed)
Medication Detail    Disp Refills Start End   carvedilol (COREG) 12.5 MG tablet 180 tablet 3 05/11/2016    Sig - Route: Take 1 tablet (12.5 mg total) by mouth 2 (two) times daily with a meal. - Oral   E-Prescribing Status: Receipt confirmed by pharmacy (05/11/2016 4:23 PM EDT)   Pharmacy   [facility]- [place], [place] - , [place] - [address]

## 2016-07-12 DIAGNOSIS — E785 Hyperlipidemia, unspecified: Secondary | ICD-10-CM | POA: Diagnosis not present

## 2016-07-12 DIAGNOSIS — Z1211 Encounter for screening for malignant neoplasm of colon: Secondary | ICD-10-CM | POA: Diagnosis not present

## 2016-07-12 DIAGNOSIS — I2510 Atherosclerotic heart disease of native coronary artery without angina pectoris: Secondary | ICD-10-CM | POA: Diagnosis not present

## 2016-07-12 DIAGNOSIS — Z0000 Encounter for general adult medical examination without abnormal findings: Secondary | ICD-10-CM | POA: Diagnosis not present

## 2016-07-24 ENCOUNTER — Telehealth: Payer: Self-pay | Admitting: Cardiovascular Disease

## 2016-07-24 NOTE — Telephone Encounter (Signed)
Adela Glimpse from [facility] is calling on behalf of patient(Jared Morse).  [name] would like Carvedilol and Adela Glimpse is following up. Adela Glimpse states that they don't have necessary paperwork from [place]. Please call at [phone]. Thanks.

## 2016-07-24 NOTE — Telephone Encounter (Signed)
Patient has refills at [facility] pharmacy. I returned call to Adela Glimpse as the message states that [facility] does not have the necessary paperwork?? and per Adela Glimpse he did not ask for any paperwork. He was requesting a refill of the medication. I made him aware of where the refills were and he will call and have them transferred.

## 2016-12-25 DIAGNOSIS — G4733 Obstructive sleep apnea (adult) (pediatric): Secondary | ICD-10-CM | POA: Diagnosis not present

## 2016-12-25 DIAGNOSIS — Z131 Encounter for screening for diabetes mellitus: Secondary | ICD-10-CM | POA: Diagnosis not present

## 2016-12-25 DIAGNOSIS — E785 Hyperlipidemia, unspecified: Secondary | ICD-10-CM | POA: Diagnosis not present

## 2016-12-25 DIAGNOSIS — I2510 Atherosclerotic heart disease of native coronary artery without angina pectoris: Secondary | ICD-10-CM | POA: Diagnosis not present

## 2017-01-14 ENCOUNTER — Ambulatory Visit: Payer: Federal, State, Local not specified - PPO | Admitting: Family Medicine

## 2017-01-16 DIAGNOSIS — D649 Anemia, unspecified: Secondary | ICD-10-CM | POA: Diagnosis not present

## 2017-01-16 DIAGNOSIS — E785 Hyperlipidemia, unspecified: Secondary | ICD-10-CM | POA: Diagnosis not present

## 2017-01-16 DIAGNOSIS — Z131 Encounter for screening for diabetes mellitus: Secondary | ICD-10-CM | POA: Diagnosis not present

## 2017-01-16 DIAGNOSIS — Z6837 Body mass index (BMI) 37.0-37.9, adult: Secondary | ICD-10-CM | POA: Diagnosis not present

## 2017-01-22 ENCOUNTER — Ambulatory Visit (INDEPENDENT_AMBULATORY_CARE_PROVIDER_SITE_OTHER): Payer: Federal, State, Local not specified - PPO | Admitting: Sports Medicine

## 2017-01-22 ENCOUNTER — Encounter: Payer: Self-pay | Admitting: Sports Medicine

## 2017-01-22 VITALS — BP 130/90 | HR 62 | Ht 69.0 in | Wt 262.4 lb

## 2017-01-22 DIAGNOSIS — I2510 Atherosclerotic heart disease of native coronary artery without angina pectoris: Secondary | ICD-10-CM

## 2017-01-22 DIAGNOSIS — M25512 Pain in left shoulder: Secondary | ICD-10-CM | POA: Insufficient documentation

## 2017-01-22 DIAGNOSIS — G8929 Other chronic pain: Secondary | ICD-10-CM

## 2017-01-22 DIAGNOSIS — M545 Low back pain: Secondary | ICD-10-CM

## 2017-01-22 DIAGNOSIS — I2583 Coronary atherosclerosis due to lipid rich plaque: Secondary | ICD-10-CM

## 2017-01-22 DIAGNOSIS — M6289 Other specified disorders of muscle: Secondary | ICD-10-CM

## 2017-01-22 MED ORDER — NAPROXEN-ESOMEPRAZOLE 500-20 MG PO TBEC: 1.0000 | DELAYED_RELEASE_TABLET | Freq: Two times a day (BID) | ORAL | 2 refills | Status: DC

## 2017-01-22 MED ORDER — NAPROXEN-ESOMEPRAZOLE 500-20 MG PO TBEC: 1.0000 | DELAYED_RELEASE_TABLET | Freq: Two times a day (BID) | ORAL | 0 refills | Status: AC

## 2017-01-22 NOTE — Assessment & Plan Note (Signed)
Patient does have markedly tight iliopsoas with isolation.  Hypermobile lumbar spine is likely contributing to overall persistent range of motion but given the symptoms of severe worsening after prolonged sitting and hours long uninterrupted sitting with his occupation with rapid standing this does seem consistent with functional core instability iliopsoas contractures.  We will have him begin with foundations training exercises and emphasis was placed on performing these on a daily basis.
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
## 2017-01-22 NOTE — Patient Instructions (Addendum)
Also check out the YouTube Video from Dr. Myles Lipps.  I would like to see you try performing this 5-6 days per week.   "Powerful Posture and Pain Relief: 12 minutes of Foundation Training" - https://youtu.be/[email or web address]   Do not try to attempt this entire video when first beginning.    Try breaking of each exercise that he goes into shorter segments.  Otherwise if they perform an exercise for 45 seconds, start with 15 seconds and rest and then resume with a begin the new activity.  Work your way up to doing this 12 minute video and I expect to see significant improvements in your pain.   Also related to do  the shoulder exercises.  Please perform the exercise program that we have prepared for you and gone over in detail on a daily basis.  In addition to the handout you were provided you can access your program through: [email or web address]   Your unique program code is: [email or web address]

## 2017-03-14 DIAGNOSIS — M545 Low back pain: Secondary | ICD-10-CM | POA: Diagnosis not present

## 2017-07-04 DIAGNOSIS — R739 Hyperglycemia, unspecified: Secondary | ICD-10-CM | POA: Diagnosis not present

## 2017-07-04 DIAGNOSIS — I10 Essential (primary) hypertension: Secondary | ICD-10-CM | POA: Diagnosis not present

## 2017-07-04 DIAGNOSIS — E785 Hyperlipidemia, unspecified: Secondary | ICD-10-CM | POA: Diagnosis not present

## 2017-07-04 DIAGNOSIS — G4733 Obstructive sleep apnea (adult) (pediatric): Secondary | ICD-10-CM | POA: Diagnosis not present

## 2017-07-04 DIAGNOSIS — R5383 Other fatigue: Secondary | ICD-10-CM | POA: Diagnosis not present

## 2017-07-04 DIAGNOSIS — I2510 Atherosclerotic heart disease of native coronary artery without angina pectoris: Secondary | ICD-10-CM | POA: Diagnosis not present

## 2017-07-12 DIAGNOSIS — G4733 Obstructive sleep apnea (adult) (pediatric): Secondary | ICD-10-CM | POA: Diagnosis not present

## 2017-07-31 DIAGNOSIS — J0100 Acute maxillary sinusitis, unspecified: Secondary | ICD-10-CM | POA: Diagnosis not present

## 2017-08-12 DIAGNOSIS — G4733 Obstructive sleep apnea (adult) (pediatric): Secondary | ICD-10-CM | POA: Diagnosis not present

## 2017-09-04 DIAGNOSIS — R509 Fever, unspecified: Secondary | ICD-10-CM | POA: Diagnosis not present

## 2017-09-04 DIAGNOSIS — J069 Acute upper respiratory infection, unspecified: Secondary | ICD-10-CM | POA: Diagnosis not present

## 2017-09-06 DIAGNOSIS — R9431 Abnormal electrocardiogram [ECG] [EKG]: Secondary | ICD-10-CM | POA: Diagnosis not present

## 2017-09-06 DIAGNOSIS — J101 Influenza due to other identified influenza virus with other respiratory manifestations: Secondary | ICD-10-CM | POA: Diagnosis not present

## 2017-09-06 DIAGNOSIS — J09X2 Influenza due to identified novel influenza A virus with other respiratory manifestations: Secondary | ICD-10-CM | POA: Diagnosis not present

## 2017-09-06 DIAGNOSIS — J4521 Mild intermittent asthma with (acute) exacerbation: Secondary | ICD-10-CM | POA: Diagnosis not present

## 2017-09-06 DIAGNOSIS — R509 Fever, unspecified: Secondary | ICD-10-CM | POA: Diagnosis not present

## 2017-09-06 DIAGNOSIS — I2510 Atherosclerotic heart disease of native coronary artery without angina pectoris: Secondary | ICD-10-CM | POA: Diagnosis not present

## 2017-09-06 DIAGNOSIS — J111 Influenza due to unidentified influenza virus with other respiratory manifestations: Secondary | ICD-10-CM | POA: Diagnosis not present

## 2017-09-06 DIAGNOSIS — R0989 Other specified symptoms and signs involving the circulatory and respiratory systems: Secondary | ICD-10-CM | POA: Diagnosis not present

## 2017-09-06 DIAGNOSIS — R0602 Shortness of breath: Secondary | ICD-10-CM | POA: Diagnosis not present

## 2017-09-10 DIAGNOSIS — J101 Influenza due to other identified influenza virus with other respiratory manifestations: Secondary | ICD-10-CM | POA: Diagnosis not present

## 2017-09-10 DIAGNOSIS — I2510 Atherosclerotic heart disease of native coronary artery without angina pectoris: Secondary | ICD-10-CM | POA: Diagnosis not present

## 2017-09-10 DIAGNOSIS — G4733 Obstructive sleep apnea (adult) (pediatric): Secondary | ICD-10-CM | POA: Diagnosis not present

## 2017-09-10 DIAGNOSIS — J45909 Unspecified asthma, uncomplicated: Secondary | ICD-10-CM | POA: Diagnosis not present

## 2017-09-11 ENCOUNTER — Encounter (HOSPITAL_BASED_OUTPATIENT_CLINIC_OR_DEPARTMENT_OTHER): Payer: Self-pay | Admitting: Emergency Medicine

## 2017-09-11 ENCOUNTER — Inpatient Hospital Stay (HOSPITAL_BASED_OUTPATIENT_CLINIC_OR_DEPARTMENT_OTHER)
Admission: EM | Admit: 2017-09-11 | Discharge: 2017-09-22 | DRG: 193 | Disposition: A | Discharge status: Home / Self Care | Payer: Federal, State, Local not specified - PPO | Attending: Internal Medicine | Admitting: Internal Medicine

## 2017-09-11 ENCOUNTER — Other Ambulatory Visit: Payer: Self-pay

## 2017-09-11 ENCOUNTER — Emergency Department (HOSPITAL_BASED_OUTPATIENT_CLINIC_OR_DEPARTMENT_OTHER): Payer: Federal, State, Local not specified - PPO

## 2017-09-11 DIAGNOSIS — E872 Acidosis: Secondary | ICD-10-CM | POA: Diagnosis present

## 2017-09-11 DIAGNOSIS — J1008 Influenza due to other identified influenza virus with other specified pneumonia: Principal | ICD-10-CM | POA: Diagnosis present

## 2017-09-11 DIAGNOSIS — Z955 Presence of coronary angioplasty implant and graft: Secondary | ICD-10-CM

## 2017-09-11 DIAGNOSIS — J180 Bronchopneumonia, unspecified organism: Secondary | ICD-10-CM | POA: Diagnosis not present

## 2017-09-11 DIAGNOSIS — Z7982 Long term (current) use of aspirin: Secondary | ICD-10-CM | POA: Diagnosis not present

## 2017-09-11 DIAGNOSIS — R001 Bradycardia, unspecified: Secondary | ICD-10-CM | POA: Diagnosis not present

## 2017-09-11 DIAGNOSIS — R197 Diarrhea, unspecified: Secondary | ICD-10-CM | POA: Diagnosis not present

## 2017-09-11 DIAGNOSIS — J189 Pneumonia, unspecified organism: Secondary | ICD-10-CM | POA: Diagnosis not present

## 2017-09-11 DIAGNOSIS — R739 Hyperglycemia, unspecified: Secondary | ICD-10-CM | POA: Diagnosis not present

## 2017-09-11 DIAGNOSIS — J9811 Atelectasis: Secondary | ICD-10-CM | POA: Diagnosis not present

## 2017-09-11 DIAGNOSIS — J101 Influenza due to other identified influenza virus with other respiratory manifestations: Secondary | ICD-10-CM | POA: Diagnosis not present

## 2017-09-11 DIAGNOSIS — Z23 Encounter for immunization: Secondary | ICD-10-CM

## 2017-09-11 DIAGNOSIS — G4733 Obstructive sleep apnea (adult) (pediatric): Secondary | ICD-10-CM | POA: Diagnosis present

## 2017-09-11 DIAGNOSIS — R945 Abnormal results of liver function studies: Secondary | ICD-10-CM

## 2017-09-11 DIAGNOSIS — R740 Nonspecific elevation of levels of transaminase and lactic acid dehydrogenase [LDH]: Secondary | ICD-10-CM | POA: Diagnosis not present

## 2017-09-11 DIAGNOSIS — R7401 Elevation of levels of liver transaminase levels: Secondary | ICD-10-CM

## 2017-09-11 DIAGNOSIS — E875 Hyperkalemia: Secondary | ICD-10-CM | POA: Diagnosis not present

## 2017-09-11 DIAGNOSIS — J9601 Acute respiratory failure with hypoxia: Secondary | ICD-10-CM | POA: Diagnosis not present

## 2017-09-11 DIAGNOSIS — R748 Abnormal levels of other serum enzymes: Secondary | ICD-10-CM | POA: Diagnosis not present

## 2017-09-11 DIAGNOSIS — I10 Essential (primary) hypertension: Secondary | ICD-10-CM | POA: Diagnosis not present

## 2017-09-11 DIAGNOSIS — R778 Other specified abnormalities of plasma proteins: Secondary | ICD-10-CM | POA: Diagnosis present

## 2017-09-11 DIAGNOSIS — R0902 Hypoxemia: Secondary | ICD-10-CM

## 2017-09-11 DIAGNOSIS — K7200 Acute and subacute hepatic failure without coma: Secondary | ICD-10-CM | POA: Diagnosis present

## 2017-09-11 DIAGNOSIS — I252 Old myocardial infarction: Secondary | ICD-10-CM | POA: Diagnosis not present

## 2017-09-11 DIAGNOSIS — I2510 Atherosclerotic heart disease of native coronary artery without angina pectoris: Secondary | ICD-10-CM | POA: Diagnosis not present

## 2017-09-11 DIAGNOSIS — R0602 Shortness of breath: Secondary | ICD-10-CM | POA: Diagnosis not present

## 2017-09-11 DIAGNOSIS — I2583 Coronary atherosclerosis due to lipid rich plaque: Secondary | ICD-10-CM | POA: Diagnosis not present

## 2017-09-11 DIAGNOSIS — J1100 Influenza due to unidentified influenza virus with unspecified type of pneumonia: Secondary | ICD-10-CM | POA: Diagnosis not present

## 2017-09-11 DIAGNOSIS — R7989 Other specified abnormal findings of blood chemistry: Secondary | ICD-10-CM | POA: Diagnosis present

## 2017-09-11 DIAGNOSIS — R918 Other nonspecific abnormal finding of lung field: Secondary | ICD-10-CM | POA: Diagnosis not present

## 2017-09-11 DIAGNOSIS — Z79899 Other long term (current) drug therapy: Secondary | ICD-10-CM

## 2017-09-11 DIAGNOSIS — I472 Ventricular tachycardia, unspecified: Secondary | ICD-10-CM

## 2017-09-11 DIAGNOSIS — J111 Influenza due to unidentified influenza virus with other respiratory manifestations: Secondary | ICD-10-CM | POA: Diagnosis not present

## 2017-09-11 DIAGNOSIS — J188 Other pneumonia, unspecified organism: Secondary | ICD-10-CM

## 2017-09-11 DIAGNOSIS — K760 Fatty (change of) liver, not elsewhere classified: Secondary | ICD-10-CM | POA: Diagnosis present

## 2017-09-11 DIAGNOSIS — R05 Cough: Secondary | ICD-10-CM | POA: Diagnosis not present

## 2017-09-11 DIAGNOSIS — I351 Nonrheumatic aortic (valve) insufficiency: Secondary | ICD-10-CM | POA: Diagnosis not present

## 2017-09-11 LAB — I-STAT VENOUS BLOOD GAS, ED
Acid-base deficit: 1 mmol/L (ref 0.0–2.0)
Bicarbonate: 22.7 mmol/L (ref 20.0–28.0)
O2 Saturation: 72 %
Patient temperature: 98.3
TCO2: 24 mmol/L (ref 22–32)
pCO2, Ven: 35.2 mmHg — ABNORMAL LOW (ref 44.0–60.0)
pH, Ven: 7.416 (ref 7.250–7.430)
pO2, Ven: 36 mmHg (ref 32.0–45.0)

## 2017-09-11 LAB — URINALYSIS, ROUTINE W REFLEX MICROSCOPIC
Bilirubin Urine: NEGATIVE
Glucose, UA: NEGATIVE mg/dL
Hgb urine dipstick: NEGATIVE
Ketones, ur: 15 mg/dL — AB
Leukocytes, UA: NEGATIVE
Nitrite: NEGATIVE
Protein, ur: 100 mg/dL — AB
Specific Gravity, Urine: 1.025 (ref 1.005–1.030)
pH: 6 (ref 5.0–8.0)

## 2017-09-11 LAB — COMPREHENSIVE METABOLIC PANEL
ALT: 142 U/L — ABNORMAL HIGH (ref 17–63)
AST: 119 U/L — ABNORMAL HIGH (ref 15–41)
Albumin: 3 g/dL — ABNORMAL LOW (ref 3.5–5.0)
Alkaline Phosphatase: 75 U/L (ref 38–126)
Anion gap: 10 (ref 5–15)
BUN: 25 mg/dL — ABNORMAL HIGH (ref 6–20)
CO2: 23 mmol/L (ref 22–32)
Calcium: 8.2 mg/dL — ABNORMAL LOW (ref 8.9–10.3)
Chloride: 102 mmol/L (ref 101–111)
Creatinine, Ser: 0.8 mg/dL (ref 0.61–1.24)
GFR calc Af Amer: >60 mL/min (ref >60)
GFR calc non Af Amer: >60 mL/min (ref >60)
Glucose, Bld: 115 mg/dL — ABNORMAL HIGH (ref 65–99)
Potassium: 4.1 mmol/L (ref 3.5–5.1)
Sodium: 135 mmol/L (ref 135–145)
Total Bilirubin: 1.1 mg/dL (ref 0.3–1.2)
Total Protein: 7.1 g/dL (ref 6.5–8.1)

## 2017-09-11 LAB — CBC WITH DIFFERENTIAL/PLATELET
Basophils Absolute: 0 10*3/uL (ref 0.0–0.1)
Basophils Relative: 0 %
Eosinophils Absolute: 0 10*3/uL (ref 0.0–0.7)
Eosinophils Relative: 0 %
HCT: 38.9 % — ABNORMAL LOW (ref 39.0–52.0)
Hemoglobin: 13.6 g/dL (ref 13.0–17.0)
Lymphocytes Relative: 11 %
Lymphs Abs: 1 10*3/uL (ref 0.7–4.0)
MCH: 29.1 pg (ref 26.0–34.0)
MCHC: 35 g/dL (ref 30.0–36.0)
MCV: 83.3 fL (ref 78.0–100.0)
Monocytes Absolute: 0.4 10*3/uL (ref 0.1–1.0)
Monocytes Relative: 5 %
Neutro Abs: 7.3 10*3/uL (ref 1.7–7.7)
Neutrophils Relative %: 84 %
Platelets: 216 10*3/uL (ref 150–400)
RBC: 4.67 MIL/uL (ref 4.22–5.81)
RDW: 14.9 % (ref 11.5–15.5)
WBC: 8.7 10*3/uL (ref 4.0–10.5)

## 2017-09-11 LAB — URINALYSIS, MICROSCOPIC (REFLEX)

## 2017-09-11 LAB — I-STAT CG4 LACTIC ACID, ED
Lactic Acid, Venous: 1.91 mmol/L — ABNORMAL HIGH (ref 0.5–1.9)
Lactic Acid, Venous: 1.63 mmol/L (ref 0.5–1.9)

## 2017-09-11 LAB — ACETAMINOPHEN LEVEL: Acetaminophen (Tylenol), Serum: 10 ug/mL (ref 10–30)

## 2017-09-11 LAB — TROPONIN I: Troponin I: 0.05 ng/mL (ref <0.03)

## 2017-09-11 MED ORDER — ALBUTEROL SULFATE (2.5 MG/3ML) 0.083% IN NEBU
2.5000 mg | INHALATION_SOLUTION | Freq: Four times a day (QID) | RESPIRATORY_TRACT | Status: DC
Start: 2017-09-11 — End: 2017-09-11
  Administered 2017-09-11: 2.5 mg via RESPIRATORY_TRACT
  Filled 2017-09-11: qty 3

## 2017-09-11 MED ORDER — SODIUM CHLORIDE 0.9 % IV SOLN
2000.0000 mg | Freq: Once | INTRAVENOUS | Status: AC
  Administered 2017-09-11: 2000 mg via INTRAVENOUS
  Filled 2017-09-11: qty 2000

## 2017-09-11 MED ORDER — SODIUM CHLORIDE 0.9 % IV BOLUS (SEPSIS)
1000.0000 mL | Freq: Once | INTRAVENOUS | Status: AC
  Administered 2017-09-11: 1000 mL via INTRAVENOUS

## 2017-09-11 MED ORDER — IOPAMIDOL (ISOVUE-300) INJECTION 61%
100.0000 mL | Freq: Once | INTRAVENOUS | Status: AC | PRN
  Administered 2017-09-11: 100 mL via INTRAVENOUS

## 2017-09-11 MED ORDER — ACETYLCYSTEINE 20 % IN SOLN
3.0000 mL | Freq: Once | RESPIRATORY_TRACT | Status: AC
  Administered 2017-09-11: 2 mL via RESPIRATORY_TRACT
  Filled 2017-09-11: qty 30

## 2017-09-11 MED ORDER — KETOROLAC TROMETHAMINE 15 MG/ML IJ SOLN
15.0000 mg | Freq: Once | INTRAMUSCULAR | Status: AC
  Administered 2017-09-11: 15 mg via INTRAVENOUS
  Filled 2017-09-11: qty 1

## 2017-09-11 MED ORDER — SODIUM CHLORIDE 0.9 % IV SOLN
Freq: Once | INTRAVENOUS | Status: AC
  Administered 2017-09-11: 21:00:00 via INTRAVENOUS

## 2017-09-11 MED ORDER — VANCOMYCIN HCL 500 MG IV SOLR
INTRAVENOUS | Status: AC
  Filled 2017-09-11: qty 2000

## 2017-09-11 MED ORDER — SODIUM CHLORIDE 0.9 % IV SOLN
1.0000 g | Freq: Once | INTRAVENOUS | Status: AC
  Administered 2017-09-11: 1 g via INTRAVENOUS
  Filled 2017-09-11: qty 10

## 2017-09-11 MED ORDER — AZITHROMYCIN 500 MG IV SOLR
INTRAVENOUS | Status: AC
  Filled 2017-09-11: qty 500

## 2017-09-11 MED ORDER — AZITHROMYCIN 500 MG IV SOLR
500.0000 mg | Freq: Once | INTRAVENOUS | Status: AC
  Administered 2017-09-11: 500 mg via INTRAVENOUS
  Filled 2017-09-11: qty 500

## 2017-09-11 MED ORDER — VANCOMYCIN HCL IN DEXTROSE 750-5 MG/150ML-% IV SOLN
750.0000 mg | Freq: Two times a day (BID) | INTRAVENOUS | Status: DC
  Administered 2017-09-12: 750 mg via INTRAVENOUS
  Filled 2017-09-11 (×3): qty 150

## 2017-09-11 MED ORDER — ALBUTEROL SULFATE (2.5 MG/3ML) 0.083% IN NEBU
5.0000 mg | INHALATION_SOLUTION | Freq: Once | RESPIRATORY_TRACT | Status: AC
  Administered 2017-09-11: 5 mg via RESPIRATORY_TRACT

## 2017-09-11 MED ORDER — ALBUTEROL SULFATE (2.5 MG/3ML) 0.083% IN NEBU
2.5000 mg | INHALATION_SOLUTION | RESPIRATORY_TRACT | Status: DC
  Administered 2017-09-11: 2.5 mg via RESPIRATORY_TRACT
  Filled 2017-09-11 (×2): qty 3

## 2017-09-11 NOTE — ED Notes (Signed)
ED Provider at bedside discussing plan of care. 

## 2017-09-11 NOTE — ED Notes (Signed)
Patient transported to X-ray 

## 2017-09-11 NOTE — ED Notes (Signed)
Patient transported to CT 

## 2017-09-11 NOTE — ED Notes (Signed)
ED Provider at bedside discussing plan of care for admission. 

## 2017-09-11 NOTE — ED Notes (Signed)
Daughter called and updated on pt's status.

## 2017-09-11 NOTE — Progress Notes (Signed)
RT placed pt on BIPAP, tolerating well at this time. RT will continue to monitor as needed.

## 2017-09-11 NOTE — ED Notes (Signed)
[facility] here for transport. 

## 2017-09-11 NOTE — ED Notes (Signed)
Attempted to call report to 2W, nurse not available for report. My phone number left and unit nurse will return call.

## 2017-09-11 NOTE — ED Triage Notes (Signed)
Pt diagnosed with Flu last week.  Cough getting worse for past few days with increased SOB and CP with cough.

## 2017-09-11 NOTE — Progress Notes (Signed)
Patient is a 57yo male with h/o asthma, CAD presenting with cough.  He was diagnosed with flu 1 week ago and then started on Augmentin for CAP 3 days ago.    CXR shows multifocal PNA post-flu.  Did not receive Tamiflu.  +hypoxia, on 4L [place] O2.  Placed on NRB for persistent hypoxia so no BIPAP for now.  Given Rocephin, Azithro; Vanc added to cover staph from post-flu PNA.  Will admit to SDU.  Jonah Blue, M.D.

## 2017-09-11 NOTE — ED Provider Notes (Addendum)
[facility] EMERGENCY DEPARTMENT Provider Note   CSN: [id number] Arrival date & time: 09/11/17  1110     History   Chief Complaint Chief Complaint  Patient presents with  . Cough    HPI Jared Morse is a 57 y.o. male with history of asthma, coronary artery disease who presents with a 10-day history of cough, fever, shortness of breath, and chest pain with cough.  Patient was diagnosed with flu 1 week ago.  He has had progressive worsening shortness of breath, worse on exertion.  Patient was initiated on Augmentin for suspected pneumonia 3 days ago.  He has also been taking prednisone, Tessalon, albuterol inhaler, Flovent, and DuoNeb nebulizer at home.  Patient reports he has not had a fever in 48 hours.  He has had some loose stools, but no vomiting or abdominal pain.  HPI  Past Medical History:  Diagnosis Date  . CAD (coronary artery disease)    EF = 0.40%,mild AR,RVSP=21mmHG  ECHO 08/26/13  . Coronary artery disease    consistent with angina/ NON STEMI (non ST elevated myocardial infaraction)  . Heart disease   . Myocardial infarction [facility]) 07/2013    Patient Active Problem List   Diagnosis Date Noted  . Multifocal pneumonia 09/11/2017  . Acute pain of left shoulder 01/22/2017  . Back pain 01/12/2015  . Nonallopathic lesion of lumbosacral region 01/12/2015  . Nonallopathic lesion of sacral region 01/12/2015  . Nonallopathic lesion of thoracic region 01/12/2015  . CAD (coronary artery disease) 07/21/2014  . Decreased cardiac ejection fraction 07/21/2014  . Melena 07/21/2014  . Abnormal ECG 07/21/2014    Past Surgical History:  Procedure Laterality Date  . CORONARY ANGIOPLASTY WITH STENT PLACEMENT  08/06/13   DES to RCA in setting of NSTEMI, 50-60 %LAD       Home Medications    Prior to Admission medications   Medication Sig Start Date End Date Taking? Authorizing Provider  atorvastatin (LIPITOR) 80 MG tablet Take 1 tablet (80 mg total) by mouth  daily. 05/11/16  Yes Wendall Stade, MD  carvedilol (COREG) 12.5 MG tablet Take 1 tablet (12.5 mg total) by mouth 2 (two) times daily with a meal. 05/11/16  Yes Wendall Stade, MD  aspirin 81 MG tablet Take 81 mg by mouth daily.    [provider]  Naproxen-Esomeprazole (VIMOVO) 500-20 MG TBEC Take 1 tablet by mouth 2 (two) times daily. 01/22/17   Andrena Mews, DO    Family History Family History  Problem Relation Age of Onset  . Hypertension Mother     Social History Social History   Tobacco Use  . Smoking status: Never Smoker  . Smokeless tobacco: Never Used  Substance Use Topics  . Alcohol use: No  . Drug use: No     Allergies   Patient has no known allergies.   Review of Systems Review of Systems  Constitutional: Negative for chills and fever (none in 48 hours).  HENT: Negative for ear pain, facial swelling and sore throat.   Respiratory: Positive for cough and shortness of breath.   Cardiovascular: Positive for chest pain (with cough only).  Gastrointestinal: Positive for diarrhea. Negative for abdominal pain, blood in stool, nausea and vomiting.  Genitourinary: Negative for dysuria.  Musculoskeletal: Negative for back pain.  Skin: Negative for rash and wound.  Neurological: Negative for headaches.  Psychiatric/Behavioral: The patient is not nervous/anxious.      Physical Exam Updated Vital Signs BP 121/66   Pulse Marland Kitchen)  49   Temp 98.3 F (36.8 C) (Oral)   Resp 20   Ht 5\' 9"  (1.753 m)   Wt 119.3 kg (263 lb 0.1 oz)   SpO2 100%   BMI 38.84 kg/m   Physical Exam  Constitutional: He appears well-developed and well-nourished. No distress.  HENT:  Head: Normocephalic and atraumatic.  Mouth/Throat: Oropharynx is clear and moist. No oropharyngeal exudate.  Eyes: Conjunctivae are normal. Pupils are equal, round, and reactive to light. Right eye exhibits no discharge. Left eye exhibits no discharge. No scleral icterus.  Neck: Normal range of motion.  Neck supple. No thyromegaly present.  Cardiovascular: Regular rhythm, normal heart sounds and intact distal pulses. Bradycardia present. Exam reveals no gallop and no friction rub.  No murmur heard. Pulmonary/Chest: No stridor. Tachypnea noted. No respiratory distress. He has decreased breath sounds. He has no wheezes. He has rales.  Crackles left base Decreased breath sounds throughout Patient 86% on room air on arrival, increased to 94% on 4 L  Abdominal: Soft. Bowel sounds are normal. He exhibits no distension. There is no tenderness. There is no rebound and no guarding.  Musculoskeletal: He exhibits no edema.  Lymphadenopathy:    He has no cervical adenopathy.  Neurological: He is alert. Coordination normal.  Skin: Skin is warm and dry. No rash noted. He is not diaphoretic. No pallor.  Psychiatric: He has a normal mood and affect.  Nursing note and vitals reviewed.    ED Treatments / Results  Labs (all labs ordered are listed, but only abnormal results are displayed) Labs Reviewed  CBC WITH DIFFERENTIAL/PLATELET - Abnormal; Notable for the following components:      Result Value   HCT 38.9 (*)    All other components within normal limits  TROPONIN I - Abnormal; Notable for the following components:   Troponin I 0.05 (*)    All other components within normal limits  COMPREHENSIVE METABOLIC PANEL - Abnormal; Notable for the following components:   Glucose, Bld 115 (*)    BUN 25 (*)    Calcium 8.2 (*)    Albumin 3.0 (*)    AST 119 (*)    ALT 142 (*)    All other components within normal limits  URINALYSIS, ROUTINE W REFLEX MICROSCOPIC - Abnormal; Notable for the following components:   Ketones, ur 15 (*)    Protein, ur 100 (*)    All other components within normal limits  URINALYSIS, MICROSCOPIC (REFLEX) - Abnormal; Notable for the following components:   Bacteria, UA RARE (*)    Squamous Epithelial / LPF 0-5 (*)    All other components within normal limits  I-STAT CG4  LACTIC ACID, ED - Abnormal; Notable for the following components:   Lactic Acid, Venous 1.91 (*)    All other components within normal limits  I-STAT VENOUS BLOOD GAS, ED - Abnormal; Notable for the following components:   pCO2, Ven 35.2 (*)    All other components within normal limits  CULTURE, BLOOD (ROUTINE X 2)  CULTURE, BLOOD (ROUTINE X 2)  ACETAMINOPHEN LEVEL  I-STAT CG4 LACTIC ACID, ED    EKG  EKG Interpretation  Date/Time:  Wednesday September 11 2017 11:27:02 EST Ventricular Rate:  48 PR Interval:    QRS Duration: 108 QT Interval:  474 QTC Calculation: 424 R Axis:   3 Text Interpretation:  Sinus bradycardia RSR' in V1 or V2, right VCD or RVH Baseline wander in lead(s) V1 Peaked TW noted in anterior leads, possible acute T  waves No reciprocal changes No ST elevations Confirmed by Shaune Pollack [phone]) on 09/11/2017 11:52:15 AM       Radiology Dg Chest 2 View  Result Date: 09/11/2017 CLINICAL DATA:  Flu like symptoms for several days.  Dizziness. EXAM: CHEST  2 VIEW COMPARISON:  06/29/2014 FINDINGS: The heart size appears normal. No pleural effusion or edema identified. Numerous nodular opacities are identified throughout both lungs. Findings are suspicious for either metastatic disease or atypical infection. Peripheral area of wedge-shaped consolidation within the right lower lobe identified. IMPRESSION: 1. Diffuse bilateral nodular opacities in both lungs are identified. Cannot rule out pulmonary metastasis versus atypical infection. Further evaluation with contrast enhanced CT of the chest is advised. Electronically Signed   By: Signa Kell M.D.   On: 09/11/2017 12:11   Ct Chest W Contrast  Result Date: 09/11/2017 CLINICAL DATA:  The patient was diagnosed with the flu last week. Worsening cough over the past few days with chest pain and shortness of breath. EXAM: CT CHEST WITH CONTRAST TECHNIQUE: Multidetector CT imaging of the chest was performed during intravenous  contrast administration. CONTRAST:  100 ml ISOVUE-300 IOPAMIDOL (ISOVUE-300) INJECTION 61% COMPARISON:  PA and lateral chest earlier today. FINDINGS: Cardiovascular: No pericardial effusion. Heart size is mildly enlarged. There is calcific coronary artery disease. No calcific aortic atherosclerosis is seen. No aneurysm. Mediastinum/Nodes: Thyroid gland appears normal. No hiatal hernia. Right hilar lymph node on image 56 of series 2 measures 1.6 cm short axis dimension. A small left hilar lymph node is also identified. No axillary lymphadenopathy. Lungs/Pleura: The patient has multifocal airspace disease most consistent with pneumonia. No nodule or mass is identified. No pleural effusion. Upper Abdomen: Low-attenuation of the liver is consistent with fatty infiltration. No acute abnormality in the visualized abdomen is seen. Musculoskeletal: Negative. IMPRESSION: Extensive bilateral airspace disease consistent with multifocal pneumonia. No evidence of neoplastic process is identified. Recommend plain film follow-up to clearing. Mildly prominent right hilar lymph node is presumably reactive. Calcific coronary atherosclerosis. Mild cardiomegaly. Fatty infiltration of the liver. Electronically Signed   By: Drusilla Kanner M.D.   On: 09/11/2017 12:58    Procedures Procedures (including critical care time)  Medications Ordered in ED Medications  azithromycin (ZITHROMAX) 500 MG injection (not administered)  vancomycin (VANCOCIN) 2,000 mg in sodium chloride 0.9 % 500 mL IVPB (2,000 mg Intravenous New Bag/Given 09/11/17 1452)  vancomycin (VANCOCIN) IVPB 750 mg/150 ml premix (not administered)  vancomycin (VANCOCIN) 500 MG powder (not administered)  albuterol (PROVENTIL) (2.5 MG/3ML) 0.083% nebulizer solution 2.5 mg (not administered)  sodium chloride 0.9 % bolus 1,000 mL (0 mLs Intravenous Stopped 09/11/17 1315)  cefTRIAXone (ROCEPHIN) 1 g in sodium chloride 0.9 % 100 mL IVPB (0 g Intravenous Stopped 09/11/17  1400)  azithromycin (ZITHROMAX) 500 mg in sodium chloride 0.9 % 250 mL IVPB (0 mg Intravenous Stopped 09/11/17 1451)  iopamidol (ISOVUE-300) 61 % injection 100 mL (100 mLs Intravenous Contrast Given 09/11/17 1237)     Initial Impression / Assessment and Plan / ED Course  I have reviewed the triage vital signs and the nursing notes.  Pertinent labs & imaging results that were available during my care of the patient were reviewed by me and considered in my medical decision making (see chart for details).     Patient initially hypoxic at 86% on arrive, put on nasal cannula 4 L and brought up to 94-95% on room air.  Patient had another episode of hypoxia on nasal cannula 4 L to 86%.  Patient placed on nonrebreather.  We discussed BiPAP, however patient states he is comfortable with nonrebreather at this time, however made aware that we may need to progress to that.  Patient with multifocal pneumonia post influenza A.  CT shows extensive bilateral airspace disease consistent with multifocal pneumonia, no evidence of neoplastic process, as well as mildly prominent right hilar lymph node presumably reactive.  Chest x-ray showed diffuse bilateral nodular opacities in both lungs and CT of the chest was recommended to rule out pulmonary metastasis.  Elevated lactate initially, which is cleared after 1 L of fluids.  Rocephin and azithromycin initiated, as well as vancomycin, which was added on to cover for possible staph from post influenza pneumonia.  LFTs are elevated, acetaminophen level pending, as patient states he has been taking Tylenol (not more than 4000 mg daily) for fever.  He has no history of alcohol abuse.  Patient does have elevated troponin, 0.05.  EKG showed hyperacute T waves, which did improve on repeat EKG.  Via [facility], Dr. Tresa Endo, cardiologist, advised to considering sepsis, follow sepsis protocol, and have hospitalist consult cardiology if needed.  I was told this after I spoke with  hospitalist, Dr. Ophelia Charter, who agrees to admission at [facility] stepdown unit.  Final Clinical Impressions(s) / ED Diagnoses   Final diagnoses:  Multifocal pneumonia    ED Discharge Orders    None           Emi Holes, PA-C 09/11/17 1640    Shaune Pollack, MD 09/12/17 [phone]

## 2017-09-11 NOTE — ED Notes (Signed)
ED Provider at bedside. 

## 2017-09-11 NOTE — ED Notes (Signed)
Report given to [facility] with [facility]. ETA 30 min. Pt updated.

## 2017-09-11 NOTE — ED Notes (Signed)
Dr. Eudelia Bunch in room with pt. Pt c/o see flashes of light that are consistent with previous migraines. Pt reports mild HA. Order for toradol received and initiated.

## 2017-09-11 NOTE — Progress Notes (Signed)
Pharmacy Antibiotic Note  Jared Morse is a 57 y.o. male admitted on 09/11/2017 with pneumonia.  Pharmacy has been consulted for vancomycin dosing. Received 1x doses of CTX/azithro in the ED today. Had flu 1 week ago. Afebrile, WBC wnl, LA down to 1.63. SCr 0.8 on admit, normalized CrCl~85.  Plan: Vancomycin 2g IV x 1; then g IV qh Monitor clinical progress, c/s, renal function F/u de-escalation plan/LOT, vancomycin trough as indicated   Height: 5\' 9"  (175.3 cm) Weight: 263 lb 0.1 oz (119.3 kg) IBW/kg (Calculated) : 70.7  Temp (24hrs), Avg:98.3 F (36.8 C), Min:98.3 F (36.8 C), Max:98.3 F (36.8 C)  Recent Labs  Lab 09/11/17 1140 09/11/17 1144 09/11/17 1350  WBC 8.7  --   --   CREATININE 0.80  --   --   LATICACIDVEN  --  1.91* 1.63    Estimated Creatinine Clearance: 133 mL/min (by C-G formula based on SCr of 0.8 mg/dL).    No Known Allergies  Babs Bertin, PharmD, BCPS Clinical Pharmacist Clinical phone for 09/11/2017 until 3:30pm: [phone] If after 3:30pm, please call main pharmacy at: [phone] 09/11/2017 2:24 PM

## 2017-09-12 ENCOUNTER — Encounter (HOSPITAL_COMMUNITY): Payer: Self-pay | Admitting: Internal Medicine

## 2017-09-12 ENCOUNTER — Inpatient Hospital Stay (HOSPITAL_COMMUNITY): Payer: Federal, State, Local not specified - PPO

## 2017-09-12 ENCOUNTER — Other Ambulatory Visit: Payer: Self-pay

## 2017-09-12 DIAGNOSIS — R778 Other specified abnormalities of plasma proteins: Secondary | ICD-10-CM | POA: Diagnosis present

## 2017-09-12 DIAGNOSIS — J9601 Acute respiratory failure with hypoxia: Secondary | ICD-10-CM | POA: Diagnosis not present

## 2017-09-12 DIAGNOSIS — R7989 Other specified abnormal findings of blood chemistry: Secondary | ICD-10-CM | POA: Diagnosis present

## 2017-09-12 DIAGNOSIS — J189 Pneumonia, unspecified organism: Secondary | ICD-10-CM

## 2017-09-12 DIAGNOSIS — R748 Abnormal levels of other serum enzymes: Secondary | ICD-10-CM | POA: Diagnosis not present

## 2017-09-12 DIAGNOSIS — G4733 Obstructive sleep apnea (adult) (pediatric): Secondary | ICD-10-CM | POA: Diagnosis not present

## 2017-09-12 DIAGNOSIS — R945 Abnormal results of liver function studies: Secondary | ICD-10-CM

## 2017-09-12 LAB — COMPREHENSIVE METABOLIC PANEL
ALK PHOS: 79 U/L (ref 38–126)
ALT: 127 U/L — ABNORMAL HIGH (ref 17–63)
ALT: 198 U/L — AB (ref 17–63)
AST: 93 U/L — AB (ref 15–41)
AST: 118 U/L — AB (ref 15–41)
Albumin: 2.4 g/dL — ABNORMAL LOW (ref 3.5–5.0)
Albumin: 2.5 g/dL — ABNORMAL LOW (ref 3.5–5.0)
Alkaline Phosphatase: 65 U/L (ref 38–126)
Anion gap: 9 (ref 5–15)
Anion gap: 8 (ref 5–15)
BUN: 21 mg/dL — AB (ref 6–20)
BUN: 15 mg/dL (ref 6–20)
CHLORIDE: 107 mmol/L (ref 101–111)
CHLORIDE: 105 mmol/L (ref 101–111)
CO2: 22 mmol/L (ref 22–32)
CO2: 24 mmol/L (ref 22–32)
CREATININE: 0.84 mg/dL (ref 0.61–1.24)
CREATININE: 0.73 mg/dL (ref 0.61–1.24)
Calcium: 7.7 mg/dL — ABNORMAL LOW (ref 8.9–10.3)
Calcium: 7.7 mg/dL — ABNORMAL LOW (ref 8.9–10.3)
GFR calc Af Amer: >60 mL/min (ref >60)
GFR calc non Af Amer: >60 mL/min (ref >60)
GLUCOSE: 140 mg/dL — AB (ref 65–99)
Glucose, Bld: 110 mg/dL — ABNORMAL HIGH (ref 65–99)
Potassium: 4 mmol/L (ref 3.5–5.1)
Potassium: 4.4 mmol/L (ref 3.5–5.1)
SODIUM: 137 mmol/L (ref 135–145)
Sodium: 138 mmol/L (ref 135–145)
Total Bilirubin: 1.4 mg/dL — ABNORMAL HIGH (ref 0.3–1.2)
Total Bilirubin: 0.8 mg/dL (ref 0.3–1.2)
Total Protein: 6 g/dL — ABNORMAL LOW (ref 6.5–8.1)
Total Protein: 6.1 g/dL — ABNORMAL LOW (ref 6.5–8.1)

## 2017-09-12 LAB — RESPIRATORY PANEL BY PCR
Adenovirus: NOT DETECTED
BORDETELLA PERTUSSIS-RVPCR: NOT DETECTED
Chlamydophila pneumoniae: NOT DETECTED
Coronavirus 229E: NOT DETECTED
Coronavirus HKU1: NOT DETECTED
Coronavirus NL63: NOT DETECTED
Coronavirus OC43: NOT DETECTED
INFLUENZA B-RVPPCR: NOT DETECTED
Influenza A: DETECTED — AB
METAPNEUMOVIRUS-RVPPCR: NOT DETECTED
Mycoplasma pneumoniae: NOT DETECTED
PARAINFLUENZA VIRUS 2-RVPPCR: NOT DETECTED
PARAINFLUENZA VIRUS 3-RVPPCR: NOT DETECTED
Parainfluenza Virus 1: NOT DETECTED
Parainfluenza Virus 4: NOT DETECTED
RESPIRATORY SYNCYTIAL VIRUS-RVPPCR: NOT DETECTED
RHINOVIRUS / ENTEROVIRUS - RVPPCR: NOT DETECTED

## 2017-09-12 LAB — CBC WITH DIFFERENTIAL/PLATELET
Basophils Absolute: 0 10*3/uL (ref 0.0–0.1)
Basophils Relative: 0 %
EOS PCT: 0 %
Eosinophils Absolute: 0 10*3/uL (ref 0.0–0.7)
HCT: 36.6 % — ABNORMAL LOW (ref 39.0–52.0)
Hemoglobin: 12.4 g/dL — ABNORMAL LOW (ref 13.0–17.0)
Lymphocytes Relative: 17 %
Lymphs Abs: 1.2 10*3/uL (ref 0.7–4.0)
MCH: 28.9 pg (ref 26.0–34.0)
MCHC: 33.9 g/dL (ref 30.0–36.0)
MCV: 85.3 fL (ref 78.0–100.0)
MONO ABS: 0.3 10*3/uL (ref 0.1–1.0)
MONOS PCT: 4 %
NEUTROS PCT: 79 %
Neutro Abs: 5.7 10*3/uL (ref 1.7–7.7)
PLATELETS: 209 10*3/uL (ref 150–400)
RBC: 4.29 MIL/uL (ref 4.22–5.81)
RDW: 15.2 % (ref 11.5–15.5)
WBC: 7.2 10*3/uL (ref 4.0–10.5)

## 2017-09-12 LAB — STREP PNEUMONIAE URINARY ANTIGEN: Strep Pneumo Urinary Antigen: NEGATIVE

## 2017-09-12 LAB — TROPONIN I
TROPONIN I: 0.03 ng/mL — AB (ref <0.03)
TROPONIN I: 0.03 ng/mL — AB (ref <0.03)
Troponin I: <0.03 ng/mL (ref <0.03)

## 2017-09-12 LAB — ACETAMINOPHEN LEVEL

## 2017-09-12 LAB — PROTIME-INR
INR: 1.13
Prothrombin Time: 14.4 seconds (ref 11.4–15.2)

## 2017-09-12 LAB — EXPECTORATED SPUTUM ASSESSMENT W GRAM STAIN, RFLX TO RESP C

## 2017-09-12 LAB — GLUCOSE, CAPILLARY: Glucose-Capillary: 166 mg/dL — ABNORMAL HIGH (ref 65–99)

## 2017-09-12 LAB — LACTIC ACID, PLASMA
LACTIC ACID, VENOUS: 1.2 mmol/L (ref 0.5–1.9)
Lactic Acid, Venous: 0.9 mmol/L (ref 0.5–1.9)

## 2017-09-12 LAB — APTT: APTT: 31 s (ref 24–36)

## 2017-09-12 LAB — LACTATE DEHYDROGENASE: LDH: 551 U/L — ABNORMAL HIGH (ref 98–192)

## 2017-09-12 LAB — PROCALCITONIN: Procalcitonin: <0.1 ng/mL

## 2017-09-12 LAB — EXPECTORATED SPUTUM ASSESSMENT W REFEX TO RESP CULTURE

## 2017-09-12 LAB — TSH: TSH: 0.47 u[IU]/mL (ref 0.350–4.500)

## 2017-09-12 LAB — HIV ANTIBODY (ROUTINE TESTING W REFLEX): HIV Screen 4th Generation wRfx: NONREACTIVE

## 2017-09-12 LAB — MRSA PCR SCREENING: MRSA by PCR: NEGATIVE

## 2017-09-12 MED ORDER — ALBUTEROL SULFATE (2.5 MG/3ML) 0.083% IN NEBU: 2.5000 mg | INHALATION_SOLUTION | RESPIRATORY_TRACT | Status: DC | PRN

## 2017-09-12 MED ORDER — HYDROCOD POLST-CPM POLST ER 10-8 MG/5ML PO SUER
5.0000 mL | Freq: Two times a day (BID) | ORAL | Status: DC
  Administered 2017-09-12 – 2017-09-22 (×21): 5 mL via ORAL
  Filled 2017-09-12 (×21): qty 5

## 2017-09-12 MED ORDER — ALBUTEROL SULFATE (2.5 MG/3ML) 0.083% IN NEBU
2.5000 mg | INHALATION_SOLUTION | RESPIRATORY_TRACT | Status: DC
  Administered 2017-09-12 (×2): 2.5 mg via RESPIRATORY_TRACT
  Filled 2017-09-12 (×2): qty 3

## 2017-09-12 MED ORDER — ONDANSETRON HCL 4 MG PO TABS: 4.0000 mg | ORAL_TABLET | Freq: Four times a day (QID) | ORAL | Status: DC | PRN

## 2017-09-12 MED ORDER — ENOXAPARIN SODIUM 40 MG/0.4ML ~~LOC~~ SOLN
40.0000 mg | Freq: Every day | SUBCUTANEOUS | Status: DC
  Administered 2017-09-12 – 2017-09-13 (×2): 40 mg via SUBCUTANEOUS
  Filled 2017-09-12 (×3): qty 0.4

## 2017-09-12 MED ORDER — ASPIRIN EC 81 MG PO TBEC
81.0000 mg | DELAYED_RELEASE_TABLET | Freq: Every day | ORAL | Status: DC
  Administered 2017-09-12 – 2017-09-22 (×11): 81 mg via ORAL
  Filled 2017-09-12 (×11): qty 1

## 2017-09-12 MED ORDER — INFLUENZA VAC SPLIT QUAD 0.5 ML IM SUSY
0.5000 mL | PREFILLED_SYRINGE | INTRAMUSCULAR | Status: AC
  Administered 2017-09-13: 0.5 mL via INTRAMUSCULAR
  Filled 2017-09-12: qty 0.5

## 2017-09-12 MED ORDER — OSELTAMIVIR PHOSPHATE 75 MG PO CAPS
75.0000 mg | ORAL_CAPSULE | Freq: Two times a day (BID) | ORAL | Status: DC
  Filled 2017-09-12: qty 1

## 2017-09-12 MED ORDER — CEFTRIAXONE SODIUM 1 G IJ SOLR
1.0000 g | INTRAMUSCULAR | Status: DC
  Filled 2017-09-12: qty 10

## 2017-09-12 MED ORDER — VANCOMYCIN HCL IN DEXTROSE 750-5 MG/150ML-% IV SOLN
750.0000 mg | Freq: Two times a day (BID) | INTRAVENOUS | Status: DC
  Administered 2017-09-12 – 2017-09-13 (×2): 750 mg via INTRAVENOUS
  Filled 2017-09-12 (×3): qty 150

## 2017-09-12 MED ORDER — METHYLPREDNISOLONE SODIUM SUCC 125 MG IJ SOLR
80.0000 mg | Freq: Three times a day (TID) | INTRAMUSCULAR | Status: DC
  Administered 2017-09-12 – 2017-09-13 (×4): 80 mg via INTRAVENOUS
  Filled 2017-09-12 (×2): qty 2
  Filled 2017-09-12: qty 1.28
  Filled 2017-09-12: qty 2
  Filled 2017-09-12: qty 1.28

## 2017-09-12 MED ORDER — IPRATROPIUM-ALBUTEROL 0.5-2.5 (3) MG/3ML IN SOLN
3.0000 mL | Freq: Four times a day (QID) | RESPIRATORY_TRACT | Status: DC
Start: 2017-09-12 — End: 2017-09-17
  Administered 2017-09-12 – 2017-09-17 (×19): 3 mL via RESPIRATORY_TRACT
  Filled 2017-09-12 (×19): qty 3

## 2017-09-12 MED ORDER — SODIUM CHLORIDE 0.9 % IV SOLN
500.0000 mg | INTRAVENOUS | Status: DC
  Administered 2017-09-12 – 2017-09-19 (×8): 500 mg via INTRAVENOUS
  Filled 2017-09-12 (×11): qty 500

## 2017-09-12 MED ORDER — IPRATROPIUM BROMIDE 0.02 % IN SOLN
0.5000 mg | RESPIRATORY_TRACT | Status: DC
  Administered 2017-09-12 (×2): 0.5 mg via RESPIRATORY_TRACT
  Filled 2017-09-12 (×2): qty 2.5

## 2017-09-12 MED ORDER — ORAL CARE MOUTH RINSE
15.0000 mL | Freq: Two times a day (BID) | OROMUCOSAL | Status: DC
  Administered 2017-09-12 – 2017-09-22 (×14): 15 mL via OROMUCOSAL

## 2017-09-12 MED ORDER — ACETAMINOPHEN 325 MG PO TABS: 650.0000 mg | ORAL_TABLET | Freq: Four times a day (QID) | ORAL | Status: DC | PRN

## 2017-09-12 MED ORDER — ONDANSETRON HCL 4 MG/2ML IJ SOLN: 4.0000 mg | Freq: Four times a day (QID) | INTRAMUSCULAR | Status: DC | PRN

## 2017-09-12 MED ORDER — ENSURE ENLIVE PO LIQD
237.0000 mL | Freq: Two times a day (BID) | ORAL | Status: DC
  Administered 2017-09-13 – 2017-09-17 (×6): 237 mL via ORAL

## 2017-09-12 MED ORDER — ORAL CARE MOUTH RINSE
15.0000 mL | Freq: Two times a day (BID) | OROMUCOSAL | Status: DC
  Administered 2017-09-12 – 2017-09-13 (×4): 15 mL via OROMUCOSAL

## 2017-09-12 MED ORDER — ACETAMINOPHEN 650 MG RE SUPP: 650.0000 mg | Freq: Four times a day (QID) | RECTAL | Status: DC | PRN

## 2017-09-12 MED ORDER — CHLORHEXIDINE GLUCONATE 0.12 % MT SOLN
15.0000 mL | Freq: Two times a day (BID) | OROMUCOSAL | Status: DC
  Administered 2017-09-12 – 2017-09-22 (×18): 15 mL via OROMUCOSAL
  Filled 2017-09-12 (×16): qty 15

## 2017-09-12 MED ORDER — GUAIFENESIN-DM 100-10 MG/5ML PO SYRP
5.0000 mL | ORAL_SOLUTION | ORAL | Status: DC | PRN
  Administered 2017-09-12 – 2017-09-17 (×6): 5 mL via ORAL
  Filled 2017-09-12 (×9): qty 5

## 2017-09-12 MED ORDER — ATORVASTATIN CALCIUM 80 MG PO TABS: 80.0000 mg | ORAL_TABLET | Freq: Every day | ORAL | Status: DC

## 2017-09-12 MED ORDER — LORAZEPAM 2 MG/ML IJ SOLN: 1.0000 mg | Freq: Once | INTRAMUSCULAR | Status: DC

## 2017-09-12 MED ORDER — IPRATROPIUM-ALBUTEROL 0.5-2.5 (3) MG/3ML IN SOLN
3.0000 mL | RESPIRATORY_TRACT | Status: DC
  Administered 2017-09-12: 3 mL via RESPIRATORY_TRACT
  Filled 2017-09-12: qty 3

## 2017-09-12 MED ORDER — CARVEDILOL 12.5 MG PO TABS
12.5000 mg | ORAL_TABLET | Freq: Two times a day (BID) | ORAL | Status: DC
  Administered 2017-09-12 – 2017-09-15 (×7): 12.5 mg via ORAL
  Filled 2017-09-12 (×8): qty 1

## 2017-09-12 MED ORDER — SODIUM CHLORIDE 0.9 % IV SOLN
INTRAVENOUS | Status: AC
  Administered 2017-09-12 (×2): via INTRAVENOUS
  Administered 2017-09-13: 125 mL/h via INTRAVENOUS

## 2017-09-12 MED ORDER — SODIUM CHLORIDE 0.9 % IV SOLN
2.0000 g | INTRAVENOUS | Status: DC
  Administered 2017-09-12 – 2017-09-19 (×8): 2 g via INTRAVENOUS
  Filled 2017-09-12 (×11): qty 20

## 2017-09-12 NOTE — Progress Notes (Signed)
CRITICAL VALUE ALERT  Critical Value: 0.03  Date & Time Notied:  2/21  0421  Provider Notified: Schorr,NP notified Orders Received/Actions taken:

## 2017-09-12 NOTE — Progress Notes (Signed)
Initial Nutrition Assessment  DOCUMENTATION CODES:   Obesity unspecified  INTERVENTION:   Ensure Enlive po BID, each supplement provides 350 kcal and 20 grams of protein  Encouraged pt to order "soft" foods off patient menu until swallowing, respiratory status improves  NUTRITION DIAGNOSIS:   Inadequate oral intake related to poor appetite, acute illness as evidenced by per patient/family report.  GOAL:   Patient will meet greater than or equal to 90% of their needs  MONITOR:   PO intake, Supplement acceptance, Labs, Weight trends  REASON FOR ASSESSMENT:   Malnutrition Screening Tool    ASSESSMENT:   57 yo male admitted with acute respiratory failure with multifocal pneumonia, +flu, fatty liver. Pt with hx of asthma, CAD  Currently on HFNC Diet advanced to Regular this AM but pt did not eat breakfast this AM, reports he "choked" on breakfast. Pt indicates only tolerating soft foods like applesauce, pudding, ice cream, etc at present. Pt reports not eating anything yesterday with poor appetite and intake for 1 week PTA.  Denies weight loss, no weight loss per weight encounters.   Labs: reviewed Meds:  NS at 125 ml/hr, solumedrol  NUTRITION - FOCUSED PHYSICAL EXAM:    Most Recent Value  Orbital Region  No depletion  Upper Arm Region  No depletion  Thoracic and Lumbar Region  No depletion  Buccal Region  No depletion  Temple Region  No depletion  Clavicle Bone Region  No depletion  Clavicle and Acromion Bone Region  No depletion  Scapular Bone Region  No depletion  Dorsal Hand  No depletion  Patellar Region  No depletion  Anterior Thigh Region  No depletion  Posterior Calf Region  No depletion  Edema (RD Assessment)  None       Diet Order:  Diet regular Room service appropriate? Yes; Fluid consistency: Thin  EDUCATION NEEDS:   No education needs have been identified at this time  Skin:  Skin Assessment: Reviewed RN Assessment  Last BM:  2/17  Height:    Ht Readings from Last 1 Encounters:  09/11/17 5\' 9"  (1.753 m)    Weight:   Wt Readings from Last 1 Encounters:  09/11/17 263 lb 10.7 oz (119.6 kg)    Ideal Body Weight:     BMI:  Body mass index is 38.94 kg/m.  Estimated Nutritional Needs:   Kcal:  2000-2200 kcals  Protein:  119-137 g  Fluid:  >/= 2 L   Romelle Starcher MS, RD, LDN, CNSC [phone] Pager  [phone] Weekend/On-Call Pager

## 2017-09-12 NOTE — Progress Notes (Signed)
RT called by RN regarding pt coughing stuff up and choking. Pt taken off of BIPAP and placed on 10L  HFNC and is tolerating well at this time. RT asked pt how pt felt he stated "I feel more comfortable." Pt sats currently 94%. RT will continue to monitor closely.

## 2017-09-12 NOTE — Progress Notes (Signed)
RT assisted in transport of patient and bipap machine from room 2W08 to room 36M06 with no complications. Vitals are stable. RT on 36M has been notified. RT will continue to monitor.

## 2017-09-12 NOTE — Progress Notes (Addendum)
Patient was having increased coughing of sputum,RT was asked to take patient off bipap. Patient is stable and resting comfortably on HFNC.   Patient also refusing Tamiflu this morning until his results from respiratory panel have resulted and has been evaluated in person by PCCM.  Per patient's wife admitting physician called her to inform her that her husband was going to be transferred to ICU overnight and that PCCM would come up and consult. No pending orders for transfer at this time  Will continue to monitor.

## 2017-09-12 NOTE — H&P (Addendum)
History and Physical    Jared Morse ZOX:[id number] DOB: 1960-08-01 DOA: 09/11/2017  PCP: Patient, No Pcp Per  Patient coming from: Home.  Chief Complaint: Shortness of breath cough.  HPI: Jared Morse is a 57 y.o. male with history of CAD status post stenting in 2014 has been experiencing shortness of breath and cough for the last week and a half.  Patient symptoms started on Monday, September 02, 2012 with cough and fever and chills.  Since symptoms persisted patient went to the PCP 2 days later and was given steroids.  As per the wife patient was tested positive for flu.  Since symptoms did not improve patient went to the ER at [facility] on Friday 2 days later, on September 06, 2017 and as per the wife chest x-ray was unremarkable and patient was discharged home.  The following day patient did well but on Sunday patient started getting worsening cough and shortness of breath and primary care physician called in Augmentin.  Patient has been taking Augmentin and prednisone since then.  Prednisone course was over yesterday.  The following day on Monday, September 09, 2017 patient started experiencing low back pain.  Also had a brief episode of retrosternal chest discomfort.  Since patient symptoms got progressively worse patient went to PCP again on February 20 and was prescribed nebulizer treatment.  Despite which patient symptoms do not get improved patient came to the ER.  ED Course: In the ER patient was found to be hypoxic and was placed on nonrebreather.  Labs reveal elevated LFTs.  Troponin was mildly elevated.  EKG was showing hyperacute T waves and cardiologist on call Dr. Tresa Endo was consulted by the ER physician who at this time requested sepsis workup.  Patient eventually underwent CT chest after chest x-ray was showing diffuse bilateral infiltrates.  CT chest shows multifocal pneumonia.  Patient was started on ceftriaxone and Zithromax and vancomycin.  Patient is presently on nonrebreather.  On my  exam patient has diffuse rhonchi and crepitations.  Patient is able to talk but has been persistently coughing.  Denies any chest pain at this time.  Denies any nausea vomiting abdominal pain or diarrhea.  Review of Systems: As per HPI, rest all negative.   Past Medical History:  Diagnosis Date  . CAD (coronary artery disease)    EF = 0.40%,mild AR,RVSP=40mmHG  ECHO 08/26/13  . Coronary artery disease    consistent with angina/ NON STEMI (non ST elevated myocardial infaraction)  . Heart disease   . Myocardial infarction [facility]) 07/2013    Past Surgical History:  Procedure Laterality Date  . CORONARY ANGIOPLASTY WITH STENT PLACEMENT  08/06/13   DES to RCA in setting of NSTEMI, 50-60 %LAD     reports that  has never smoked. he has never used smokeless tobacco. He reports that he does not drink alcohol or use drugs.  No Known Allergies  Family History  Problem Relation Age of Onset  . Hypertension Mother     Prior to Admission medications   Medication Sig Start Date End Date Taking? Authorizing Provider  atorvastatin (LIPITOR) 80 MG tablet Take 1 tablet (80 mg total) by mouth daily. 05/11/16  Yes Wendall Stade, MD  carvedilol (COREG) 12.5 MG tablet Take 1 tablet (12.5 mg total) by mouth 2 (two) times daily with a meal. 05/11/16  Yes Wendall Stade, MD  aspirin 81 MG tablet Take 81 mg by mouth daily.    [provider]  Naproxen-Esomeprazole (VIMOVO) 500-20 MG  TBEC Take 1 tablet by mouth 2 (two) times daily. 01/22/17   Andrena Mews, DO    Physical Exam: Vitals:   09/11/17 2000 09/11/17 2030 09/11/17 2238 09/11/17 2347  BP: 113/66 118/65 137/76   Pulse: (!) 55 (!) 59    Resp: 18 20    Temp:   98.6 F (37 C)   TempSrc:   Axillary   SpO2: 96% 97%  97%  Weight:   119.6 kg (263 lb 10.7 oz)   Height:   5\' 9"  (1.753 m)       Constitutional: Moderately built and nourished. Vitals:   09/11/17 2000 09/11/17 2030 09/11/17 2238 09/11/17 2347  BP: 113/66 118/65  137/76   Pulse: (!) 55 (!) 59    Resp: 18 20    Temp:   98.6 F (37 C)   TempSrc:   Axillary   SpO2: 96% 97%  97%  Weight:   119.6 kg (263 lb 10.7 oz)   Height:   5\' 9"  (1.753 m)    Eyes: Anicteric no pallor. ENMT: No discharge from the ears eyes nose or mouth. Neck: No JVD appreciated no mass felt. Respiratory: Bilateral diffuse rhonchi and crepitations. Cardiovascular: S1-S2 heard no murmurs appreciated. Abdomen: Soft nontender bowel sounds present. Musculoskeletal: No edema.  No joint effusion. Skin: No rash.  Skin appears warm. Neurologic: Alert awake oriented to time place and person.  Moves all extremities. Psychiatric: Appears normal.  Normal affect.   Labs on Admission: I have personally reviewed following labs and imaging studies  CBC: Recent Labs  Lab 09/11/17 1140  WBC 8.7  NEUTROABS 7.3  HGB 13.6  HCT 38.9*  MCV 83.3  PLT 216   Basic Metabolic Panel: Recent Labs  Lab 09/11/17 1140  NA 135  K 4.1  CL 102  CO2 23  GLUCOSE 115*  BUN 25*  CREATININE 0.80  CALCIUM 8.2*   GFR: Estimated Creatinine Clearance: 133.3 mL/min (by C-G formula based on SCr of 0.8 mg/dL). Liver Function Tests: Recent Labs  Lab 09/11/17 1140  AST 119*  ALT 142*  ALKPHOS 75  BILITOT 1.1  PROT 7.1  ALBUMIN 3.0*   No results for input(s): LIPASE, AMYLASE in the last 168 hours. No results for input(s): AMMONIA in the last 168 hours. Coagulation Profile: No results for input(s): INR, PROTIME in the last 168 hours. Cardiac Enzymes: Recent Labs  Lab 09/11/17 1140  TROPONINI 0.05*   BNP (last 3 results) No results for input(s): PROBNP in the last 8760 hours. HbA1C: No results for input(s): HGBA1C in the last 72 hours. CBG: No results for input(s): GLUCAP in the last 168 hours. Lipid Profile: No results for input(s): CHOL, HDL, LDLCALC, TRIG, CHOLHDL, LDLDIRECT in the last 72 hours. Thyroid Function Tests: No results for input(s): TSH, T4TOTAL, FREET4, T3FREE,  THYROIDAB in the last 72 hours. Anemia Panel: No results for input(s): VITAMINB12, FOLATE, FERRITIN, TIBC, IRON, RETICCTPCT in the last 72 hours. Urine analysis:    Component Value Date/Time   COLORURINE YELLOW 09/11/2017 1312   APPEARANCEUR CLEAR 09/11/2017 1312   LABSPEC 1.025 09/11/2017 1312   PHURINE 6.0 09/11/2017 1312   GLUCOSEU NEGATIVE 09/11/2017 1312   HGBUR NEGATIVE 09/11/2017 1312   BILIRUBINUR NEGATIVE 09/11/2017 1312   KETONESUR 15 (A) 09/11/2017 1312   PROTEINUR 100 (A) 09/11/2017 1312   NITRITE NEGATIVE 09/11/2017 1312   LEUKOCYTESUR NEGATIVE 09/11/2017 1312   Sepsis Labs: @LABRCNTIP (procalcitonin:4,lacticidven:4) )No results found for this or any previous visit (from the past  240 hour(s)).   Radiological Exams on Admission: Dg Chest 2 View  Result Date: 09/11/2017 CLINICAL DATA:  Flu like symptoms for several days.  Dizziness. EXAM: CHEST  2 VIEW COMPARISON:  06/29/2014 FINDINGS: The heart size appears normal. No pleural effusion or edema identified. Numerous nodular opacities are identified throughout both lungs. Findings are suspicious for either metastatic disease or atypical infection. Peripheral area of wedge-shaped consolidation within the right lower lobe identified. IMPRESSION: 1. Diffuse bilateral nodular opacities in both lungs are identified. Cannot rule out pulmonary metastasis versus atypical infection. Further evaluation with contrast enhanced CT of the chest is advised. Electronically Signed   By: Signa Kell M.D.   On: 09/11/2017 12:11   Ct Chest W Contrast  Result Date: 09/11/2017 CLINICAL DATA:  The patient was diagnosed with the flu last week. Worsening cough over the past few days with chest pain and shortness of breath. EXAM: CT CHEST WITH CONTRAST TECHNIQUE: Multidetector CT imaging of the chest was performed during intravenous contrast administration. CONTRAST:  100 ml ISOVUE-300 IOPAMIDOL (ISOVUE-300) INJECTION 61% COMPARISON:  PA and lateral  chest earlier today. FINDINGS: Cardiovascular: No pericardial effusion. Heart size is mildly enlarged. There is calcific coronary artery disease. No calcific aortic atherosclerosis is seen. No aneurysm. Mediastinum/Nodes: Thyroid gland appears normal. No hiatal hernia. Right hilar lymph node on image 56 of series 2 measures 1.6 cm short axis dimension. A small left hilar lymph node is also identified. No axillary lymphadenopathy. Lungs/Pleura: The patient has multifocal airspace disease most consistent with pneumonia. No nodule or mass is identified. No pleural effusion. Upper Abdomen: Low-attenuation of the liver is consistent with fatty infiltration. No acute abnormality in the visualized abdomen is seen. Musculoskeletal: Negative. IMPRESSION: Extensive bilateral airspace disease consistent with multifocal pneumonia. No evidence of neoplastic process is identified. Recommend plain film follow-up to clearing. Mildly prominent right hilar lymph node is presumably reactive. Calcific coronary atherosclerosis. Mild cardiomegaly. Fatty infiltration of the liver. Electronically Signed   By: Drusilla Kanner M.D.   On: 09/11/2017 12:58    EKG: Independently reviewed.  Normal sinus rhythm with hyperacute T waves in the anterior leads.  Assessment/Plan Principal Problem:   Acute respiratory failure with hypoxia (HCC) Active Problems:   CAD (coronary artery disease)   Multifocal pneumonia   Elevated LFTs   Elevated troponin    1. Acute respiratory failure with hypoxia likely secondary to multifocal pneumonia/ARDS -patient is on vancomycin and ceftriaxone and Zithromax for pneumonia.  Will check respiratory viral panel, check urine for Legionella strep antigen blood cultures sputum cultures HIV status.  Continue on BiPAP.  I have also place patient on nebulizer. I am going to consult pulmonary critical care.  Patient will be kept n.p.o. except medications. 2. Elevated LFTs with acetaminophen level around 10.   Will discuss with pulmonary critical care and also poison control.  Check INR and follow LFTs.  Avoid acetaminophen for now.  Hold statins. 3. Elevated troponin with hyperacute T waves and history of CAD -had chest pain 3 days ago.  Presently chest pain-free.  We will cycle cardiac markers aspirin check INR check 2D echo.  Will get cardiology consult.  Hold statins due to elevated LFTs.   Addendum -discussed with pulmonary critical care Dr. Nicholos Johns who advised patient to be placed on Solu-Medrol 80 mg IV every 8 and DuoNeb and start Tamiflu.  They will be seeing patient in consult.   DVT prophylaxis: Lovenox if INR is normal. Code Status: Full code. Family Communication: Patient's  wife. Disposition Plan: Home. Consults called: Pulmonary critical care and cardiology. Admission status: Inpatient.   Eduard Clos MD [facility] Pager [phone].  If 7PM-7AM, please contact night-coverage [email or web address] Password [facility]  09/12/2017, 12:39 AM

## 2017-09-12 NOTE — Consult Note (Signed)
PULMONARY / CRITICAL CARE MEDICINE   Name: Jared Morse MRN: [id number] DOB: 20-May-1961    ADMISSION DATE:  09/11/2017 CONSULTATION DATE: 09/12/2017  REFERRING MD:  [facility]  CHIEF COMPLAINT:  Chest pain dry cough  HISTORY OF PRESENT ILLNESS:    Jared Morse is a 57 year old male with a remote history of smoking but does have a secondhand smoking experience.  He does have history of asthma.  He he reports a 10-day history of increasing malaise, intermittent fevers, dry nonproductive cough.  He does note chest pain and does have a history of having a stent in 2014 but he says this is not usual chest pain. He went to his primary care physician and was given steroids and was reported to have a positive flu but was too far out for treatment with Tamiflu. Despite treatment he continued to worsen especially when his steroids were completed.  He went to the emergency room at [facility] and was treated with a nebulizer treatment he presented to the emergency room on 4/20 after being seen by his primary care physician and CT scan revealed extensive bilateral airspace disease. He was briefly placed on BiPAP but did not tolerate well but does not appear to be in acute respiratory distress at the time of this examination.  He is obviously weak and some mild disorientation. Pulmonary critical care asked to evaluate.  He is on broad-spectrum antibiotics at this time, nasal cannula with O2 sats 92%.  He is sitting up in the bed and is mildly confused.  His wife answers most of his questions for him but he does not seem to be short of breath with talking. To the fact he is proven refractory to treatment and has been in respiratory distress think the prudent thing to do was transferred to ICU for further evaluation and treatment on 09/12/2017 PAST MEDICAL HISTORY :  He  has a past medical history of CAD (coronary artery disease), Coronary artery disease, Heart disease, and Myocardial infarction (HCC) (07/2013).  PAST  SURGICAL HISTORY: He  has a past surgical history that includes Coronary angioplasty with stent (08/06/13).  No Known Allergies  No current facility-administered medications on file prior to encounter.    Current Outpatient Medications on File Prior to Encounter  Medication Sig  . acetaminophen (TYLENOL) 325 MG tablet Take 650 mg by mouth every 6 (six) hours as needed for mild pain.  Marland Kitchen albuterol (PROVENTIL) (2.5 MG/3ML) 0.083% nebulizer solution Take 3 mLs by nebulization 3 (three) times daily as needed for wheezing.  Marland Kitchen amoxicillin-clavulanate (AUGMENTIN) 875-125 MG tablet Take 1 tablet by mouth 2 (two) times daily.  Marland Kitchen aspirin 81 MG tablet Take 81 mg by mouth daily.  Marland Kitchen atorvastatin (LIPITOR) 80 MG tablet Take 1 tablet (80 mg total) by mouth daily. (Patient taking differently: Take 80 mg by mouth daily at 6 PM. )  . benzonatate (TESSALON) 100 MG capsule Take 100 mg by mouth 3 (three) times daily.  . carvedilol (COREG) 12.5 MG tablet Take 1 tablet (12.5 mg total) by mouth 2 (two) times daily with a meal.  . FLOVENT HFA 110 MCG/ACT inhaler Inhale 1 puff into the lungs 2 (two) times daily.  Marland Kitchen HYDROcodone-homatropine (HYCODAN) 5-1.5 MG/5ML syrup Take 5 mLs by mouth at bedtime as needed for cough. May suppress respiratory drive  . ibuprofen (ADVIL,MOTRIN) 200 MG tablet Take 200 mg by mouth every 6 (six) hours as needed for moderate pain.  Marland Kitchen ipratropium-albuterol (DUONEB) 0.5-2.5 (3) MG/3ML SOLN Take 3 mLs by nebulization  every 8 (eight) hours as needed for wheezing. X 15 days starting 2/19  . nitroGLYCERIN (NITROSTAT) 0.4 MG SL tablet Place 0.4 mg under the tongue every 5 (five) minutes as needed for chest pain.  . predniSONE (STERAPRED UNI-PAK 48 TAB) 10 MG (48) TBPK tablet Take 10 mg by mouth as directed. As directed by physician  . PROAIR HFA 108 (90 Base) MCG/ACT inhaler Inhale 1 puff into the lungs every 6 (six) hours as needed for wheezing.  . fluticasone (FLONASE) 50 MCG/ACT nasal spray Place  2 sprays into both nostrils daily.    FAMILY HISTORY:  His indicated that his mother is alive. He indicated that his father is alive.   SOCIAL HISTORY: He  reports that  has never smoked. he has never used smokeless tobacco. He reports that he does not drink alcohol or use drugs.  REVIEW OF SYSTEMS:   10 point review of system taken, please see HPI for positives and negatives.   SUBJECTIVE:  57 year old male is in mild distress.  He has extensive bilateral airspace disease on the CT scan and is moderately refractory to current treatments therefore we will transfer him to the ICU.  VITAL SIGNS: BP 135/73 (BP Location: Right Arm)   Pulse 63   Temp 98.4 F (36.9 C) (Oral)   Resp (!) 21   Ht 5\' 9"  (1.753 m)   Wt 119.6 kg (263 lb 10.7 oz)   SpO2 95%   BMI 38.94 kg/m   HEMODYNAMICS:    VENTILATOR SETTINGS: FiO2 (%):  [35 %-40 %] 40 %  INTAKE / OUTPUT: I/O last 3 completed shifts: In: 2898.8 [P.O.:355; I.V.:543.8; IV Piggyback:2000] Out: 1200 [Urine:1200]  PHYSICAL EXAMINATION: General: Well-nourished well-developed male, who sitting up in bed does not appear in acute distress Neuro: Questionable mild confusion but follows commands and answers questions well HEENT: No JVD is appreciated, oropharynx is unremarkable Cardiovascular: Heart sounds are regular rate and rhythm Lungs: Decreased air movement Abdomen: Soft nontender Musculoskeletal: Intact  skin: Warm and moist  LABS:  BMET Recent Labs  Lab 09/11/17 1140 09/12/17 0257  NA 135 138  K 4.1 4.0  CL 102 107  CO2 23 22  BUN 25* 21*  CREATININE 0.80 0.84  GLUCOSE 115* 110*    Electrolytes Recent Labs  Lab 09/11/17 1140 09/12/17 0257  CALCIUM 8.2* 7.7*    CBC Recent Labs  Lab 09/11/17 1140 09/12/17 0257  WBC 8.7 7.2  HGB 13.6 12.4*  HCT 38.9* 36.6*  PLT 216 209    Coag's Recent Labs  Lab 09/12/17 0257  APTT 31  INR 1.13    Sepsis Markers Recent Labs  Lab 09/11/17 1350  09/12/17 0052 09/12/17 0257  LATICACIDVEN 1.63 1.2 0.9  PROCALCITON  --   --  <0.10    ABG No results for input(s): PHART, PCO2ART, PO2ART in the last 168 hours.  Liver Enzymes Recent Labs  Lab 09/11/17 1140 09/12/17 0257  AST 119* 93*  ALT 142* 127*  ALKPHOS 75 65  BILITOT 1.1 1.4*  ALBUMIN 3.0* 2.4*    Cardiac Enzymes Recent Labs  Lab 09/11/17 1140 09/12/17 0257 09/12/17 0601  TROPONINI 0.05* 0.03* 0.03*    Glucose No results for input(s): GLUCAP in the last 168 hours.  Imaging Dg Chest 2 View  Result Date: 09/11/2017 CLINICAL DATA:  Flu like symptoms for several days.  Dizziness. EXAM: CHEST  2 VIEW COMPARISON:  06/29/2014 FINDINGS: The heart size appears normal. No pleural effusion or edema identified. Numerous nodular  opacities are identified throughout both lungs. Findings are suspicious for either metastatic disease or atypical infection. Peripheral area of wedge-shaped consolidation within the right lower lobe identified. IMPRESSION: 1. Diffuse bilateral nodular opacities in both lungs are identified. Cannot rule out pulmonary metastasis versus atypical infection. Further evaluation with contrast enhanced CT of the chest is advised. Electronically Signed   By: Signa Kell M.D.   On: 09/11/2017 12:11   Ct Chest W Contrast  Result Date: 09/11/2017 CLINICAL DATA:  The patient was diagnosed with the flu last week. Worsening cough over the past few days with chest pain and shortness of breath. EXAM: CT CHEST WITH CONTRAST TECHNIQUE: Multidetector CT imaging of the chest was performed during intravenous contrast administration. CONTRAST:  100 ml ISOVUE-300 IOPAMIDOL (ISOVUE-300) INJECTION 61% COMPARISON:  PA and lateral chest earlier today. FINDINGS: Cardiovascular: No pericardial effusion. Heart size is mildly enlarged. There is calcific coronary artery disease. No calcific aortic atherosclerosis is seen. No aneurysm. Mediastinum/Nodes: Thyroid gland appears normal. No  hiatal hernia. Right hilar lymph node on image 56 of series 2 measures 1.6 cm short axis dimension. A small left hilar lymph node is also identified. No axillary lymphadenopathy. Lungs/Pleura: The patient has multifocal airspace disease most consistent with pneumonia. No nodule or mass is identified. No pleural effusion. Upper Abdomen: Low-attenuation of the liver is consistent with fatty infiltration. No acute abnormality in the visualized abdomen is seen. Musculoskeletal: Negative. IMPRESSION: Extensive bilateral airspace disease consistent with multifocal pneumonia. No evidence of neoplastic process is identified. Recommend plain film follow-up to clearing. Mildly prominent right hilar lymph node is presumably reactive. Calcific coronary atherosclerosis. Mild cardiomegaly. Fatty infiltration of the liver. Electronically Signed   By: Drusilla Kanner M.D.   On: 09/11/2017 12:58     STUDIES:  09/11/2017 CT scan with bilateral airspace disease consistent with multifocal pneumonia  CULTURES: 09/11/2017 sputum culture>> 09/11/2017 blood cultures obtained>>  ANTIBIOTICS: 09/11/2017 Zithromax>> 09/11/2017 Rocephin>>   SIGNIFICANT EVENTS: 09/11/2017 admit to hospital  LINES/TUBES:   DISCUSSION: Mr. Shirk is a 57 year old male with a remote history of smoking but does have a secondhand smoking experience.  He does have history of asthma.  He he reports a 10-day history of increasing malaise, intermittent fevers, dry nonproductive cough.  He does note chest pain and does have a history of having a stent in 2014 but he says this is not usual chest pain. He went to his primary care physician and was given steroids and was reported to have a positive flu but was too far out for treatment with Tamiflu. Despite treatment he continued to worsen especially when his steroids were completed.  He went to the emergency room at [facility] and was treated with a nebulizer treatment he presented to the emergency room on  4/20 after being seen by his primary care physician and CT scan revealed extensive bilateral airspace disease. He was briefly placed on BiPAP but did not tolerate well but does not appear to be in acute respiratory distress at the time of this examination.  He is obviously weak and some mild disorientation. Pulmonary critical care asked to evaluate.  He is on broad-spectrum antibiotics at this time, nasal cannula with O2 sats 92%.  He is sitting up in the bed and is mildly confused.  His wife answers most of his questions for him but he does not seem to be short of breath with talking. To the fact he is proven refractory to treatment and has been in respiratory distress think  the prudent thing to do was transferred to ICU for further evaluation and treatment on 09/12/2017  ASSESSMENT / PLAN:  PULMONARY A: Extensive bilateral airspace disease consistent with multifocal pneumonia Reported recent positive flu antigen Recent treatment with steroids Dry cough for last 2 weeks P:   O2 as needed Check ABG for completeness Continue antibiotics until culture data complete Continue steroids He does not appear to need intensive care unit at this time on 09/12/2017 11:30 AM but should he decline any at all he will be transferred to intensive care unit.   CARDIOVASCULAR A:  History of stent 2014 for coronary artery disease Reports chest pain is noncardiac in origin Troponins 0.03  Note he is followed by Dr. Traci Sermon p:  P: Follow troponins Cardiology consult if needed  RENAL Lab Results  Component Value Date   CREATININE 0.84 09/12/2017   CREATININE 0.80 09/11/2017   CREATININE 0.80 06/29/2014   Recent Labs  Lab 09/11/17 1140 09/12/17 0257  K 4.1 4.0   A:   No acute issues P:   Monitor renal function  GASTROINTESTINAL A:   GI protection Mild elevation of LFTs P:   PPI Follow liver function tests  HEMATOLOGIC Recent Labs    09/11/17 1140 09/12/17 0257  HGB 13.6 12.4*     A:   No acute issues P:  DVT prophylaxis  INFECTIOUS A:   Note reportedly to have positive flu titer CT chest x-ray consistent with bilateral pneumonia P:   Continue current antibiotic  ENDOCRINE     A:   Mild hyperglycemia P:   Monitor glucose on labs  NEUROLOGIC A:   Generalized weakness P:   RASS goal: 0 Careful with sedation   FAMILY  - Updates: Wife and patient updated extensively 09/12/2017.  Wife is extremely concerned about radiographic evidence of pneumonia and is insistent that he get the proper treatment.  She was informed that we will review his x-ray, review his medications and further evaluate him.  At time of my examination he is not acute distress.  - Inter-disciplinary family meet or Palliative Care meeting due by:  day 7    [facility] ACNP Adolph Pollack PCCM Pager [phone] till 1 pm If no answer page [phone] 09/12/2017, 11:24 AM

## 2017-09-12 NOTE — Progress Notes (Signed)
PROGRESS NOTE    Jared Morse   ZOX:[id number]  DOB: 1961/05/22  DOA: 09/11/2017 PCP: Patient, No Pcp Per   Brief Narrative:  Jared Morse  is a 57 y.o. male with history of CAD status post stenting in 2014 who was diagnosed with the flu recently. He did not receive Tamiflu as his physician stated that he was out of the window. He was eventually started on Augmentin and Prednisone as his symptoms did not improve. He finished the Prednisone 1 day prior to admission. His symptoms still did not improve but actually worsened and he presented to the ER with respiratory distress and hypoxia.     Subjective: Feeling less dyspnea today.  He has a cough. Gets short of breath mainly with movement.  ROS: no complaints of nausea, vomiting, constipation diarrhea, cough, dyspnea or dysuria. No other complaints.   Assessment & Plan:   Principal Problem:   Acute respiratory failure with hypoxia - Lactic acidosis/ severe sepsis - due to Influenza A and subsequently due to bilateral pneumonia - continue to treat at [facility]- MRSA PCR neg and therefore Vanc stopped - cont BiPAP PRN and cont O2 to keep pulse ox > 90% - PCCM consulted last night by admitter - follow in SDU  Active Problems:   CAD (coronary artery disease)/   Elevated troponin - noted to have chest pain as outpt- troponin are minimally elevated and are likely due to cardiac strain. No furhter work up per cardiology- outpt f/u with Dr Eden Emms in 3-4 months    Elevated LFTs - mild and may be due to current infection   DVT prophylaxis: Lovenox Code Status: Full code Family Communication:  Disposition Plan: cont to follow in SDU Consultants:   PCCM  Cardiology Procedures:    Antimicrobials:  Anti-infectives (From admission, onward)   Start     Dose/Rate Route Frequency Ordered Stop   09/12/17 1430  vancomycin (VANCOCIN) IVPB 750 mg/150 ml premix     750 mg 150 mL/hr over 60 Minutes Intravenous Every 12 hours 09/12/17 1254     09/12/17 1300  azithromycin (ZITHROMAX) 500 mg in sodium chloride 0.9 % 250 mL IVPB     500 mg 250 mL/hr over 60 Minutes Intravenous Every 24 hours 09/12/17 0045     09/12/17 1300  cefTRIAXone (ROCEPHIN) 1 g in sodium chloride 0.9 % 100 mL IVPB  Status:  Discontinued     1 g 200 mL/hr over 30 Minutes Intravenous Every 24 hours 09/12/17 0045 09/12/17 1251   09/12/17 1300  cefTRIAXone (ROCEPHIN) 2 g in sodium chloride 0.9 % 100 mL IVPB     2 g 200 mL/hr over 30 Minutes Intravenous Every 24 hours 09/12/17 1251     09/12/17 0230  vancomycin (VANCOCIN) IVPB 750 mg/150 ml premix  Status:  Discontinued     750 mg 150 mL/hr over 60 Minutes Intravenous Every 12 hours 09/11/17 1424 09/12/17 0859   09/12/17 0200  oseltamivir (TAMIFLU) capsule 75 mg  Status:  Discontinued     75 mg Oral 2 times daily 09/12/17 0125 09/12/17 0859   09/11/17 1441  vancomycin (VANCOCIN) 500 MG powder    Comments:  Cordie Grice   : cabinet override      09/11/17 1441 09/12/17 0244   09/11/17 1430  vancomycin (VANCOCIN) 2,000 mg in sodium chloride 0.9 % 500 mL IVPB     2,000 mg 250 mL/hr over 120 Minutes Intravenous  Once 09/11/17 1424 09/11/17 1704   09/11/17 1236  azithromycin (  ZITHROMAX) 500 MG injection    Comments:  Cordie Grice   : cabinet override      09/11/17 1236 09/12/17 0044   09/11/17 1230  cefTRIAXone (ROCEPHIN) 1 g in sodium chloride 0.9 % 100 mL IVPB     1 g 200 mL/hr over 30 Minutes Intravenous  Once 09/11/17 1224 09/11/17 1400   09/11/17 1230  azithromycin (ZITHROMAX) 500 mg in sodium chloride 0.9 % 250 mL IVPB     500 mg 250 mL/hr over 60 Minutes Intravenous  Once 09/11/17 1224 09/11/17 1451       Objective: Vitals:   09/12/17 0757 09/12/17 1142 09/12/17 1156 09/12/17 1157  BP:  125/77 125/77   Pulse: 63 (!) 58 61   Resp:  18 18   Temp:  98.5 F (36.9 C)    TempSrc:  Oral    SpO2:  (!) 88% 95% 95%  Weight:      Height:        Intake/Output Summary (Last 24 hours) at 09/12/2017  1420 Last data filed at 09/12/2017 1400 Gross per 24 hour  Intake 3138.33 ml  Output 1200 ml  Net 1938.33 ml   Filed Weights   09/11/17 1122 09/11/17 2238  Weight: 119.3 kg (263 lb 0.1 oz) 119.6 kg (263 lb 10.7 oz)    Examination: General exam: Appears comfortable  HEENT: PERRLA, oral mucosa moist, no sclera icterus or thrush Respiratory system: Crackles at bases- Respiratory effort normal. 92% on 10 L O2 Cardiovascular system: S1 & S2 heard, RRR.  No murmurs  Gastrointestinal system: Abdomen soft, non-tender, nondistended. Normal bowel sound. No organomegaly Central nervous system: Alert and oriented. No focal neurological deficits. Extremities: No cyanosis, clubbing or edema Skin: No rashes or ulcers Psychiatry:  Mood & affect appropriate.   Data Reviewed: I have personally reviewed following labs and imaging studies  CBC: Recent Labs  Lab 09/11/17 1140 09/12/17 0257  WBC 8.7 7.2  NEUTROABS 7.3 5.7  HGB 13.6 12.4*  HCT 38.9* 36.6*  MCV 83.3 85.3  PLT 216 209   Basic Metabolic Panel: Recent Labs  Lab 09/11/17 1140 09/12/17 0257  NA 135 138  K 4.1 4.0  CL 102 107  CO2 23 22  GLUCOSE 115* 110*  BUN 25* 21*  CREATININE 0.80 0.84  CALCIUM 8.2* 7.7*   GFR: Estimated Creatinine Clearance: 126.9 mL/min (by C-G formula based on SCr of 0.84 mg/dL). Liver Function Tests: Recent Labs  Lab 09/11/17 1140 09/12/17 0257  AST 119* 93*  ALT 142* 127*  ALKPHOS 75 65  BILITOT 1.1 1.4*  PROT 7.1 6.0*  ALBUMIN 3.0* 2.4*   No results for input(s): LIPASE, AMYLASE in the last 168 hours. No results for input(s): AMMONIA in the last 168 hours. Coagulation Profile: Recent Labs  Lab 09/12/17 0257  INR 1.13   Cardiac Enzymes: Recent Labs  Lab 09/11/17 1140 09/12/17 0257 09/12/17 0601 09/12/17 1241  TROPONINI 0.05* 0.03* 0.03* <0.03   BNP (last 3 results) No results for input(s): PROBNP in the last 8760 hours. HbA1C: No results for input(s): HGBA1C in the last  72 hours. CBG: No results for input(s): GLUCAP in the last 168 hours. Lipid Profile: No results for input(s): CHOL, HDL, LDLCALC, TRIG, CHOLHDL, LDLDIRECT in the last 72 hours. Thyroid Function Tests: Recent Labs    09/12/17 0052  TSH 0.470   Anemia Panel: No results for input(s): VITAMINB12, FOLATE, FERRITIN, TIBC, IRON, RETICCTPCT in the last 72 hours. Urine analysis:    Component Value  Date/Time   COLORURINE YELLOW 09/11/2017 1312   APPEARANCEUR CLEAR 09/11/2017 1312   LABSPEC 1.025 09/11/2017 1312   PHURINE 6.0 09/11/2017 1312   GLUCOSEU NEGATIVE 09/11/2017 1312   HGBUR NEGATIVE 09/11/2017 1312   BILIRUBINUR NEGATIVE 09/11/2017 1312   KETONESUR 15 (A) 09/11/2017 1312   PROTEINUR 100 (A) 09/11/2017 1312   NITRITE NEGATIVE 09/11/2017 1312   LEUKOCYTESUR NEGATIVE 09/11/2017 1312   Sepsis Labs: @LABRCNTIP (procalcitonin:4,lacticidven:4) ) Recent Results (from the past 240 hour(s))  MRSA PCR Screening     Status: None   Collection Time: 09/11/17 10:20 PM  Result Value Ref Range Status   MRSA by PCR NEGATIVE NEGATIVE Final    Comment:        The GeneXpert MRSA Assay (FDA approved for NASAL specimens only), is one component of a comprehensive MRSA colonization surveillance program. It is not intended to diagnose MRSA infection nor to guide or monitor treatment for MRSA infections. Performed at [facility], [address], [place], [place] [address]   Culture, sputum-assessment     Status: None   Collection Time: 09/12/17 12:36 AM  Result Value Ref Range Status   Specimen Description EXPECTORATED SPUTUM  Final   Special Requests NONE  Final   Sputum evaluation   Final    THIS SPECIMEN IS ACCEPTABLE FOR SPUTUM CULTURE Performed at [facility], [address] [place], [place] [address]    Report Status 09/12/2017 FINAL  Final  Culture, respiratory (NON-Expectorated)     Status: None (Preliminary result)   Collection Time: 09/12/17 12:36 AM  Result  Value Ref Range Status   Specimen Description EXPECTORATED SPUTUM  Final   Special Requests NONE Reflexed from [id number]  Final   Gram Stain   Final    NO WBC SEEN RARE SQUAMOUS EPITHELIAL CELLS PRESENT FEW GRAM NEGATIVE RODS RARE GRAM POSITIVE COCCI IN CLUSTERS Performed at [facility], [address], [place], [place] [address]    Culture PENDING  Incomplete   Report Status PENDING  Incomplete         Radiology Studies: Dg Chest 2 View  Result Date: 09/11/2017 CLINICAL DATA:  Flu like symptoms for several days.  Dizziness. EXAM: CHEST  2 VIEW COMPARISON:  06/29/2014 FINDINGS: The heart size appears normal. No pleural effusion or edema identified. Numerous nodular opacities are identified throughout both lungs. Findings are suspicious for either metastatic disease or atypical infection. Peripheral area of wedge-shaped consolidation within the right lower lobe identified. IMPRESSION: 1. Diffuse bilateral nodular opacities in both lungs are identified. Cannot rule out pulmonary metastasis versus atypical infection. Further evaluation with contrast enhanced CT of the chest is advised. Electronically Signed   By: Signa Kell M.D.   On: 09/11/2017 12:11   Ct Chest W Contrast  Result Date: 09/11/2017 CLINICAL DATA:  The patient was diagnosed with the flu last week. Worsening cough over the past few days with chest pain and shortness of breath. EXAM: CT CHEST WITH CONTRAST TECHNIQUE: Multidetector CT imaging of the chest was performed during intravenous contrast administration. CONTRAST:  100 ml ISOVUE-300 IOPAMIDOL (ISOVUE-300) INJECTION 61% COMPARISON:  PA and lateral chest earlier today. FINDINGS: Cardiovascular: No pericardial effusion. Heart size is mildly enlarged. There is calcific coronary artery disease. No calcific aortic atherosclerosis is seen. No aneurysm. Mediastinum/Nodes: Thyroid gland appears normal. No hiatal hernia. Right hilar lymph node on image 56 of series 2 measures  1.6 cm short axis dimension. A small left hilar lymph node is also  identified. No axillary lymphadenopathy. Lungs/Pleura: The patient has multifocal airspace disease most consistent with pneumonia. No nodule or mass is identified. No pleural effusion. Upper Abdomen: Low-attenuation of the liver is consistent with fatty infiltration. No acute abnormality in the visualized abdomen is seen. Musculoskeletal: Negative. IMPRESSION: Extensive bilateral airspace disease consistent with multifocal pneumonia. No evidence of neoplastic process is identified. Recommend plain film follow-up to clearing. Mildly prominent right hilar lymph node is presumably reactive. Calcific coronary atherosclerosis. Mild cardiomegaly. Fatty infiltration of the liver. Electronically Signed   By: Drusilla Kanner M.D.   On: 09/11/2017 12:58      Scheduled Meds: . aspirin EC  81 mg Oral Daily  . carvedilol  12.5 mg Oral BID WC  . chlorhexidine  15 mL Mouth Rinse BID  . chlorpheniramine-HYDROcodone  5 mL Oral Q12H  . enoxaparin (LOVENOX) injection  40 mg Subcutaneous Daily  . feeding supplement (ENSURE ENLIVE)  237 mL Oral BID BM  . [START ON 09/13/2017] Influenza vac split quadrivalent PF  0.5 mL Intramuscular Tomorrow-1000  . ipratropium-albuterol  3 mL Nebulization Q6H  . LORazepam  1 mg Intravenous Once  . mouth rinse  15 mL Mouth Rinse q12n4p  . methylPREDNISolone (SOLU-MEDROL) injection  80 mg Intravenous Q8H   Continuous Infusions: . sodium chloride Stopped (09/12/17 1331)  . azithromycin 500 mg (09/12/17 1324)  . cefTRIAXone (ROCEPHIN)  IV    . vancomycin 750 mg (09/12/17 1315)     LOS: 1 day    Time spent in minutes: 35    Calvert Cantor, MD [facility] Pager: [email or web address] Password [email or web address] 09/12/2017, 2:20 PM

## 2017-09-12 NOTE — Consult Note (Addendum)
Cardiology Consultation:   Patient ID: Jared Morse; [id number]; 1960/12/30   Admit date: 09/11/2017 Date of Consult: 09/12/2017  Primary Care Provider: Patient, No Pcp Per Primary Cardiologist: Jared Morse  Patient Profile:   Jared Morse is a 57 y.o. male with a hx of NSTEMI s/p PCI (DES>>RCA 2015 with remaining 50-60% lesion LAD) and HLD who is being seen today for the evaluation of chest pain at the request of Jared Morse.   History of Present Illness:   Jared Morse is a pleasant 57 yo gentleman, pt of Jared Morse, with a hx of NSTEMI treated with PCI to RCA in 2015 and HLD who presented to the ED on 09/11/17 with c/o ongoing Flu positive SOB, upper respiratory infection and mild chest discomfort. He states that he was initially seen on 09/04/17 for a persistent cough, fever and chills by his PCP and was given steroids. His symptoms did not improve with the steroids and therefore the patient presented to [facility] emergency department on 09/06/17 where a chest x-ray was performed, which was unremarkable and the patient was discharged home. On Sunday, 09/07/17 the patient reported worsening cough and shortness of breath, contacted his PCP who called in Augmentin to be started. On Monday, 09/09/17, the patient began experiencing low back pain and had a brief episode of mild chest discomfort which was constant in nature, thought to be exacerbated by his relentless coughing episodes. He states that the pain was not similar to that he had felt with his NSTEMI in 2015, it did not radiate and had no associated ACS symptoms. His respiratory  symptoms continued to persist, therefore he once again contacted his PCP who prescribed a nebulizer treatment. He tried this with no improvement and proceeded to the emergency department on 09/11/17 for further evaluation. He denies arm, neck, or back pain radiation. No reports of N/V, diaphoresis, dizziness, syncope, LE swelling, abdominal pain or bleeding in stool or  urine. He does report that the chest discomfort was relieved after his hypoxia and respiratory status was stabilized in the ED. His CP has not re-occurred since this time.   In the emergency department, the patient was found to be hypoxic and placed on a nonrebreather. He was afebrile with a normal WBC count. A troponin level was drawn which was minimally abnormal at 0.03. An EKG revealed hyperacute T waves (consistent with previous readings) and the on call cardiologist was consulted by the ED physician who requested a sepsis workup at that time. A chest CT revealed diffuse bilateral infiltrates, and multifocal pneumonia.  The patient was therefore started on ceftriaxone, azithromycin and vancomycin. His TSH was WNL at 0.470. He did have mildly elevated LFT's at AST-93 and ALT-127 with a low albumin at 2.4.   Of note, his last echocardiogram on 07/26/14 revealed an EF of 55-60% with mild AR and trivial MR.  Additionally, he underwent a follow-up Myoview stress test on 07/27/14 following his non-STEMI which was normal.  He has not had any further ischemic cardiac workup since that time and reports that he has not had any recurrent CP episodes since that time. He was last seen by Jared Morse on 05/11/16. He is currently chest pain free.  Past Medical History:  Diagnosis Date  . CAD (coronary artery disease)    EF = 0.40%,mild AR,RVSP=92mmHG  ECHO 08/26/13  . Coronary artery disease    consistent with angina/ NON STEMI (non ST elevated myocardial infaraction)  . Heart disease   . Myocardial infarction (  HCC) 07/2013    Past Surgical History:  Procedure Laterality Date  . CORONARY ANGIOPLASTY WITH STENT PLACEMENT  08/06/13   DES to RCA in setting of NSTEMI, 50-60 %LAD     Prior to Admission medications   Medication Sig Start Date End Date Taking? Authorizing Provider  aspirin 81 MG tablet Take 81 mg by mouth daily.   Yes [provider]  atorvastatin (LIPITOR) 80 MG tablet Take 1 tablet (80  mg total) by mouth daily. Patient taking differently: Take 80 mg by mouth daily at 6 PM.  05/11/16  Yes Wendall Stade, MD  carvedilol (COREG) 12.5 MG tablet Take 1 tablet (12.5 mg total) by mouth 2 (two) times daily with a meal. 05/11/16  Yes Wendall Stade, MD  Naproxen-Esomeprazole (VIMOVO) 500-20 MG TBEC Take 1 tablet by mouth 2 (two) times daily. 01/22/17   Andrena Mews, DO    Inpatient Medications: Scheduled Meds: . albuterol  2.5 mg Nebulization Q4H  . aspirin EC  81 mg Oral Daily  . carvedilol  12.5 mg Oral BID WC  . chlorhexidine  15 mL Mouth Rinse BID  . chlorpheniramine-HYDROcodone  5 mL Oral Q12H  . enoxaparin (LOVENOX) injection  40 mg Subcutaneous Daily  . [START ON 09/13/2017] Influenza vac split quadrivalent PF  0.5 mL Intramuscular Tomorrow-1000  . ipratropium  0.5 mg Nebulization Q4H  . LORazepam  1 mg Intravenous Once  . mouth rinse  15 mL Mouth Rinse q12n4p  . methylPREDNISolone (SOLU-MEDROL) injection  80 mg Intravenous Q8H   Continuous Infusions: . sodium chloride 125 mL/hr at 09/12/17 0139  . azithromycin    . cefTRIAXone (ROCEPHIN)  IV     PRN Meds: albuterol, guaiFENesin-dextromethorphan, ondansetron **OR** ondansetron (ZOFRAN) IV  Allergies:   No Known Allergies  Social History:   Social History   Socioeconomic History  . Marital status: Married    Spouse name: Not on file  . Number of children: Not on file  . Years of education: Not on file  . Highest education level: Not on file  Social Needs  . Financial resource strain: Not on file  . Food insecurity - worry: Not on file  . Food insecurity - inability: Not on file  . Transportation needs - medical: Not on file  . Transportation needs - non-medical: Not on file  Occupational History  . Not on file  Tobacco Use  . Smoking status: Never Smoker  . Smokeless tobacco: Never Used  Substance and Sexual Activity  . Alcohol use: No  . Drug use: No  . Sexual activity: Not on file  Other  Topics Concern  . Not on file  Social History Narrative  . Not on file    Family History:   Family History  Problem Relation Age of Onset  . Hypertension Mother    Family Status:  Family Status  Relation Name Status  . Mother  Alive  . Father  Alive    ROS:  Please see the history of present illness.  All other ROS reviewed and negative.     Physical Exam/Data:   Vitals:   09/12/17 0349 09/12/17 0400 09/12/17 0752 09/12/17 0757  BP:  124/74 135/73   Pulse:  64 (!) 54 63  Resp:   (!) 21   Temp:  98.8 F (37.1 C) 98.4 F (36.9 C)   TempSrc:  Oral Oral   SpO2: 94% 94% 95%   Weight:      Height:  Intake/Output Summary (Last 24 hours) at 09/12/2017 1031 Last data filed at 09/12/2017 0655 Gross per 24 hour  Intake 2898.75 ml  Output 1200 ml  Net 1698.75 ml   Filed Weights   09/11/17 1122 09/11/17 2238  Weight: 263 lb 0.1 oz (119.3 kg) 263 lb 10.7 oz (119.6 kg)   Body mass index is 38.94 kg/m.   General: Well developed, well nourished, NAD Skin: Warm, dry, intact  Head: Normocephalic, atraumatic, clear, moist mucus membranes. Neck: Negative for carotid bruits. No JVD Lungs: + wheezes and rhonchi throughout all lung fields. Breathing is unlabored at rest, worsens with activity. Bipap PRN Cardiovascular: RRR with S1 S2. No murmurs, rubs, or gallops Abdomen: Soft, non-tender, non-distended with normoactive bowel sounds. No obvious abdominal masses. MSK: Strength and tone appear normal for age. 5/5 in all extremities Extremities: No edema. No clubbing or cyanosis. DP/PT pulses 2+ bilaterally Neuro: Alert and oriented. No focal deficits. No facial asymmetry. MAE spontaneously. Psych: Responds to questions appropriately with normal affect.     EKG:  The EKG was personally reviewed and demonstrates: 09/11/17 NSR with hyperacute T-waves, found on previous tracings. No ischemic ST-T wave changes.  Telemetry:  Telemetry was personally reviewed and demonstrates:   09/12/17 NSR HR 62  Relevant CV Studies:  ECHO: 07/26/2014 Study Conclusions  - Left ventricle: The cavity size was normal. Systolic function was normal. The estimated ejection fraction was in the range of 55% to 60%. Wall motion was normal; there were no regional wall motion abnormalities. - Aortic valve: There was mild regurgitation. - Mitral valve: There was trivial regurgitation. - Tricuspid valve: There was trivial regurgitation.  CATH:  08/04/2013: Report scanned and not able to input into consult note, however available for review  Myoview Stress Test: 07/27/14 Normal perfusion EF normal by echo   Laboratory Data:  Chemistry Recent Labs  Lab 09/11/17 1140 09/12/17 0257  NA 135 138  K 4.1 4.0  CL 102 107  CO2 23 22  GLUCOSE 115* 110*  BUN 25* 21*  CREATININE 0.80 0.84  CALCIUM 8.2* 7.7*  GFRNONAA >60 >60  GFRAA >60 >60  ANIONGAP 10 9    Total Protein  Date Value Ref Range Status  09/12/2017 6.0 (L) 6.5 - 8.1 g/dL Final   Albumin  Date Value Ref Range Status  09/12/2017 2.4 (L) 3.5 - 5.0 g/dL Final   AST  Date Value Ref Range Status  09/12/2017 93 (H) 15 - 41 U/L Final   ALT  Date Value Ref Range Status  09/12/2017 127 (H) 17 - 63 U/L Final   Alkaline Phosphatase  Date Value Ref Range Status  09/12/2017 65 38 - 126 U/L Final   Total Bilirubin  Date Value Ref Range Status  09/12/2017 1.4 (H) 0.3 - 1.2 mg/dL Final   Hematology Recent Labs  Lab 09/11/17 1140 09/12/17 0257  WBC 8.7 7.2  RBC 4.67 4.29  HGB 13.6 12.4*  HCT 38.9* 36.6*  MCV 83.3 85.3  MCH 29.1 28.9  MCHC 35.0 33.9  RDW 14.9 15.2  PLT 216 209   Cardiac Enzymes Recent Labs  Lab 09/11/17 1140 09/12/17 0257 09/12/17 0601  TROPONINI 0.05* 0.03* 0.03*   No results for input(s): TROPIPOC in the last 168 hours.  BNPNo results for input(s): BNP, PROBNP in the last 168 hours.  DDimer No results for input(s): DDIMER in the last 168 hours. TSH:  Lab Results  Component  Value Date   TSH 0.470 09/12/2017   Lipids:No results found  for: CHOL, HDL, LDLCALC, LDLDIRECT, TRIG, CHOLHDL HgbA1c:No results found for: HGBA1C  Radiology/Studies:  Dg Chest 2 View  Result Date: 09/11/2017 CLINICAL DATA:  Flu like symptoms for several days.  Dizziness. EXAM: CHEST  2 VIEW COMPARISON:  06/29/2014 FINDINGS: The heart size appears normal. No pleural effusion or edema identified. Numerous nodular opacities are identified throughout both lungs. Findings are suspicious for either metastatic disease or atypical infection. Peripheral area of wedge-shaped consolidation within the right lower lobe identified. IMPRESSION: 1. Diffuse bilateral nodular opacities in both lungs are identified. Cannot rule out pulmonary metastasis versus atypical infection. Further evaluation with contrast enhanced CT of the chest is advised. Electronically Signed   By: Signa Kell M.D.   On: 09/11/2017 12:11   Ct Chest W Contrast  Result Date: 09/11/2017 CLINICAL DATA:  The patient was diagnosed with the flu last week. Worsening cough over the past few days with chest pain and shortness of breath. EXAM: CT CHEST WITH CONTRAST TECHNIQUE: Multidetector CT imaging of the chest was performed during intravenous contrast administration. CONTRAST:  100 ml ISOVUE-300 IOPAMIDOL (ISOVUE-300) INJECTION 61% COMPARISON:  PA and lateral chest earlier today. FINDINGS: Cardiovascular: No pericardial effusion. Heart size is mildly enlarged. There is calcific coronary artery disease. No calcific aortic atherosclerosis is seen. No aneurysm. Mediastinum/Nodes: Thyroid gland appears normal. No hiatal hernia. Right hilar lymph node on image 56 of series 2 measures 1.6 cm short axis dimension. A small left hilar lymph node is also identified. No axillary lymphadenopathy. Lungs/Pleura: The patient has multifocal airspace disease most consistent with pneumonia. No nodule or mass is identified. No pleural effusion. Upper Abdomen:  Low-attenuation of the liver is consistent with fatty infiltration. No acute abnormality in the visualized abdomen is seen. Musculoskeletal: Negative. IMPRESSION: Extensive bilateral airspace disease consistent with multifocal pneumonia. No evidence of neoplastic process is identified. Recommend plain film follow-up to clearing. Mildly prominent right hilar lymph node is presumably reactive. Calcific coronary atherosclerosis. Mild cardiomegaly. Fatty infiltration of the liver. Electronically Signed   By: Drusilla Kanner M.D.   On: 09/11/2017 12:58    Assessment and Plan:   1. Chest pain with known hx of CAD: -Denies chest pain this AM. Reports pain was exacerbated by coughing. Given the absence of ACS symptoms, negative trop level, and normal EKG, anticipate his chest pressure is directly related to  his acute pulmonary illness  -Trop, 0.03, 0.03>>trending  -Hyperacute T waves on admission EKG, similar to previous tracings -Hx of CAD s/p PCI to RCA and known remaining 50-60% LAD lesion (2015) per cath report  -ASA 81mg , Coreg 12.5mg  PO BID -Echo pending -No statin given elevated LFT's -May need firther ischemisc evaluation, however given his acute respiratory illness, negative trop, and no chest pain, would consider at a later time when he is more stable  2. Acute respiratory failure with hypoxia secondary to PNA: -Per PCCM -Bipap/nonrebreather ventilation -Pt to be transferred to ICU for closer monitoring  -Solumedrol 80mg  IV Q8H and Tamiflu added today -Extensive bilateral infiltrates on CXR and + influenza resistant to treatment so far -Abx adjusted  3. Elevated LFT's: -Hold statin given elevated AST/ALT -Follow and continue to hold hepatotoxic meds   For questions or updates, please contact [facility] Please consult [email or web address] for contact info under Cardiology/STEMI.   Raliegh Ip NP-C [facility] Pager: [phone] 09/12/2017 10:31 AM  History and all data  above reviewed.  Patient examined.  I agree with the findings as above.  The patient presents with complications  of the flu.   Troponin borderline without any diagnostic trend.  EKG without ischemia.  Pain is not angina.   The patient exam reveals COR:RRR  ,  Lungs: Decreased   ,  Abd: bronchospasm, Ext No edema  .  All available labs, radiology testing, previous records reviewed. Agree with documented assessment and plan. Chest pain:  Atypical.  No evidence of ischemia.  Pneumonia and musculoskeletal are the etiologies of the pain.  No indication for further cardiac testing. I will cancel the echo.  He should follow with Jared Morse in three or four months as a routine as he has not seen him in a few years.   Fayrene Fearing Derrious Bologna  1:53 PM  09/12/2017

## 2017-09-13 DIAGNOSIS — J1100 Influenza due to unidentified influenza virus with unspecified type of pneumonia: Secondary | ICD-10-CM | POA: Diagnosis not present

## 2017-09-13 DIAGNOSIS — J9601 Acute respiratory failure with hypoxia: Secondary | ICD-10-CM | POA: Diagnosis not present

## 2017-09-13 DIAGNOSIS — R748 Abnormal levels of other serum enzymes: Secondary | ICD-10-CM | POA: Diagnosis not present

## 2017-09-13 DIAGNOSIS — J101 Influenza due to other identified influenza virus with other respiratory manifestations: Secondary | ICD-10-CM | POA: Diagnosis not present

## 2017-09-13 DIAGNOSIS — J189 Pneumonia, unspecified organism: Secondary | ICD-10-CM | POA: Diagnosis not present

## 2017-09-13 LAB — HEPATITIS PANEL, ACUTE
HCV AB: 0.2 {s_co_ratio} (ref 0.0–0.9)
HEP A IGM: NEGATIVE
HEP B C IGM: NEGATIVE
Hepatitis B Surface Ag: NEGATIVE

## 2017-09-13 LAB — BLOOD GAS, ARTERIAL
ACID-BASE EXCESS: 2.8 mmol/L — AB (ref 0.0–2.0)
Bicarbonate: 27 mmol/L (ref 20.0–28.0)
Delivery systems: POSITIVE
EXPIRATORY PAP: 8
FIO2: 60
INSPIRATORY PAP: 14
O2 SAT: 92.3 %
PCO2 ART: 42.7 mmHg (ref 32.0–48.0)
PH ART: 7.417 (ref 7.350–7.450)
PO2 ART: 73.3 mmHg — AB (ref 83.0–108.0)
Patient temperature: 98.6
RATE: 12 resp/min

## 2017-09-13 LAB — LEGIONELLA PNEUMOPHILA SEROGP 1 UR AG: L. PNEUMOPHILA SEROGP 1 UR AG: NEGATIVE

## 2017-09-13 MED ORDER — SODIUM CHLORIDE 0.9 % IV SOLN
0.0000 ug/min | INTRAVENOUS | Status: DC
  Filled 2017-09-13: qty 1

## 2017-09-13 MED ORDER — METHYLPREDNISOLONE SODIUM SUCC 40 MG IJ SOLR
40.0000 mg | Freq: Two times a day (BID) | INTRAMUSCULAR | Status: DC
  Administered 2017-09-13 – 2017-09-18 (×10): 40 mg via INTRAVENOUS
  Filled 2017-09-13 (×10): qty 1

## 2017-09-13 MED ORDER — PNEUMOCOCCAL VAC POLYVALENT 25 MCG/0.5ML IJ INJ
0.5000 mL | INJECTION | INTRAMUSCULAR | Status: AC
  Administered 2017-09-22: 0.5 mL via INTRAMUSCULAR
  Filled 2017-09-13 (×2): qty 0.5

## 2017-09-13 MED ORDER — DOCUSATE SODIUM 100 MG PO CAPS
100.0000 mg | ORAL_CAPSULE | Freq: Two times a day (BID) | ORAL | Status: DC
  Administered 2017-09-13 – 2017-09-18 (×6): 100 mg via ORAL
  Filled 2017-09-13 (×18): qty 1

## 2017-09-13 NOTE — Progress Notes (Signed)
Placed pt on bipap and pt tolerating well. RT to cont to monitor. Settings: R-12, 14/8, and 60%

## 2017-09-13 NOTE — Progress Notes (Signed)
57 year old never smoker admitted with 10 days of febrile illness found to have influenza A and severe hypoxemia due to bilateral multifocal nodular infiltrates clarified on CT chest which I personally reviewed.  He required BiPAP overnight but has transitioned to high flow nasal cannula Has mild shortness of breath and desaturates when he uses the bedside commode. Exam shows no accessory muscle use and bilateral scattered crackles, no rhonchi, no edema or JVD He is able to speak in full sentences  Labs and imaging were reviewed.  Impression/plan Acute hypoxic respiratory failure-continue high flow nasal cannula daytime, okay to use CPAP during sleep for OSA with oxygen blended in and if unable to maintain saturations with this may very well need BiPAP while acutely ill  Influenza a with pneumonia-reasonable to treat as severe CAP, will discontinue vancomycin since MRSA PCR is negative  Elevated troponin due to demand ischemia, cardiology follow-up as outpatient  Keep in ICU until oxygen requirements decrease and then can transfer to telemetry  Jared Morse V. Vassie Loll MD

## 2017-09-13 NOTE — Progress Notes (Addendum)
0715 Bedside shift report, pt resting in bed with BiPap on. Dgt at bedside, updated with POC. Fall precautions in place, [facility].   0745 Pt assessed, see flow sheet. Updated with POC, pt switched to high flow nasal cannula. NAD, no complaints, WCTM.   0940 Pt medicated per MAR, no complaints at this time, on 14L high flow nasal cannula, O2 sats low 90s. WCTM.   1015 Dr. Vassie Loll at bedside, new orders received.   1130 Pt sleeping, easy to arouse, refused bath at this time, stated he was tired. WCTM.   1430 Pt still sleeping, easy to arouse, no complaints. WCTM.

## 2017-09-14 ENCOUNTER — Encounter: Payer: Self-pay | Admitting: Pulmonary Disease

## 2017-09-14 DIAGNOSIS — R748 Abnormal levels of other serum enzymes: Secondary | ICD-10-CM

## 2017-09-14 DIAGNOSIS — J1100 Influenza due to unidentified influenza virus with unspecified type of pneumonia: Secondary | ICD-10-CM

## 2017-09-14 DIAGNOSIS — J9601 Acute respiratory failure with hypoxia: Secondary | ICD-10-CM | POA: Diagnosis not present

## 2017-09-14 DIAGNOSIS — J189 Pneumonia, unspecified organism: Secondary | ICD-10-CM | POA: Diagnosis not present

## 2017-09-14 LAB — CULTURE, RESPIRATORY

## 2017-09-14 LAB — CULTURE, RESPIRATORY W GRAM STAIN
Culture: NORMAL
Gram Stain: NONE SEEN

## 2017-09-14 MED ORDER — ENOXAPARIN SODIUM 40 MG/0.4ML ~~LOC~~ SOLN
40.0000 mg | SUBCUTANEOUS | Status: DC
  Administered 2017-09-14 – 2017-09-22 (×9): 40 mg via SUBCUTANEOUS
  Filled 2017-09-14 (×9): qty 0.4

## 2017-09-14 NOTE — Plan of Care (Signed)
Patient has transferred to SDU today from ICU; continues to progress slowly toward care goals; remains on HFNC @ 14L; desats quickly if off O2 for any length of time at all.  Patient is pleasant and cooperative; no voiced complaints at this time.  Will continue efforts to decrease O2 demand, increase activity tolerance, and decreased respiratory complications overall.

## 2017-09-14 NOTE — Consult Note (Signed)
PULMONARY / CRITICAL CARE MEDICINE   Name: Jared Morse MRN: [id number] DOB: November 10, 1960    ADMISSION DATE:  09/11/2017 CONSULTATION DATE: 09/12/2017  REFERRING MD:  triad  CHIEF COMPLAINT:  Chest pain dry cough  HISTORY OF PRESENT ILLNESS:  57 year old male, remote smoker, with PMH of asthma who was admitted 2/20 with reports of a 10-day history of increasing malaise, intermittent fevers, dry nonproductive cough.  He does note chest pain and does have a history of having a stent in 2014 but he says this is not usual chest pain.  He went to his primary care physician and was given steroids and was reported to have a positive flu but was too far out for treatment with Tamiflu.  Despite treatment he continued to worsen especially when his steroids were completed.  He went to the emergency room at [facility] and was treated with a nebulizer treatment he presented to the emergency room on 4/20 after being seen by his primary care physician and CT scan revealed extensive bilateral airspace disease.  He was briefly placed on BiPAP but did not tolerate well but does not appear to be in acute respiratory distress at the time of this examination.  He is obviously weak and some mild disorientation.  Pulmonary critical care asked to evaluate 2/22.  He is on broad-spectrum antibiotics at this time, nasal cannula with O2 sats 92%.  He is sitting up in the bed and is mildly confused.  His wife answers most of his questions for him but he does not seem to be short of breath with talking.  SUBJECTIVE:  No acute events overnight.  Daughter reports he is doing better but continues to desaturate with activity.  On BiPAP.  VITAL SIGNS: BP 124/70   Pulse (!) 43   Temp 98.1 F (36.7 C) (Axillary)   Resp 13   Ht 5\' 9"  (1.753 m)   Wt 263 lb 10.7 oz (119.6 kg)   SpO2 95%   BMI 38.94 kg/m   HEMODYNAMICS:    VENTILATOR SETTINGS: FiO2 (%):  [60 %] 60 %  INTAKE / OUTPUT: I/O last 3 completed shifts: In: 1790  [P.O.:480; I.V.:750; Other:60; IV Piggyback:500] Out: 400 [Urine:400]  PHYSICAL EXAMINATION: General: well developed adult male in NAD, lying in bed on BiPAP HEENT: MM pink/moist PSY: calm/appropriate  Neuro: AAOx4, MAE  CV: s1s2 rrr, no m/r/g PULM: even/non-labored, lungs bilaterally coarse, RLL faint crackles  WU:JWJX, non-tender, bsx4 active  Extremities: warm/dry, no edema  Skin: no rashes or lesions  LABS:  BMET Recent Labs  Lab 09/11/17 1140 09/12/17 0257 09/12/17 1844  NA 135 138 137  K 4.1 4.0 4.4  CL 102 107 105  CO2 23 22 24   BUN 25* 21* 15  CREATININE 0.80 0.84 0.73  GLUCOSE 115* 110* 140*    Electrolytes Recent Labs  Lab 09/11/17 1140 09/12/17 0257 09/12/17 1844  CALCIUM 8.2* 7.7* 7.7*    CBC Recent Labs  Lab 09/11/17 1140 09/12/17 0257  WBC 8.7 7.2  HGB 13.6 12.4*  HCT 38.9* 36.6*  PLT 216 209    Coag's Recent Labs  Lab 09/12/17 0257  APTT 31  INR 1.13    Sepsis Markers Recent Labs  Lab 09/11/17 1350 09/12/17 0052 09/12/17 0257  LATICACIDVEN 1.63 1.2 0.9  PROCALCITON  --   --  <0.10    ABG Recent Labs  Lab 09/13/17 0521  PHART 7.417  PCO2ART 42.7  PO2ART 73.3*    Liver Enzymes Recent Labs  Lab 09/11/17  1140 09/12/17 0257 09/12/17 1844  AST 119* 93* 118*  ALT 142* 127* 198*  ALKPHOS 75 65 79  BILITOT 1.1 1.4* 0.8  ALBUMIN 3.0* 2.4* 2.5*    Cardiac Enzymes Recent Labs  Lab 09/12/17 0257 09/12/17 0601 09/12/17 1241  TROPONINI 0.03* 0.03* <0.03    Glucose Recent Labs  Lab 09/12/17 1550  GLUCAP 166*    Imaging No results found.   STUDIES:  2/20 CT Chest >> bilateral airspace disease consistent with multifocal pneumonia  CULTURES: Sputum 2/20 >>  BCx2 2/20 >>   ANTIBIOTICS: Azithro 2/20 >>  Rocephin 2/20 >>    SIGNIFICANT EVENTS: 2/20  Admit   LINES/TUBES:   DISCUSSION: 57 y/o M, remote smoker, with hx of asthma admitted with malaise, fever and non-productive cough.  Seen by PCP  and treated with Tamiflu for positive flu swab.  Despite therapy, he continued to worsen.  Admitted 2/20 with a CT chest showing extensive bilateral airspace disease. Intermittently required BiPAP.  Slowly improving 2/22.    ASSESSMENT / PLAN:  PULMONARY A: Extensive bilateral airspace disease consistent with multifocal pneumonia Reported recent positive flu antigen Recent treatment with steroids Cough - nonproductive  P:   O2 to support sats > 90% Intermittent BiPAP for work of breathing  ABX as above Reduce Solu-medrol 40 mg IV QD with taper to off  Follow intermittent CXR  PT efforts / mobilize  Duoneb Q6  PRN tussionex for cough   CARDIOVASCULAR A:  CAD s/p Stent 2014 Chest Pain - negative troponin, likely pleuritic  P: SDU monitoring  Continue home coreg   GASTROINTESTINAL A:   Mild elevation of LFTs P:   Trend LFT's   HEMATOLOGIC A:   No acute issues P:  Lovenox for DVT prophylaxis   INFECTIOUS A:   Influenza  Diffuse Bilateral Infiltrates - viral pneumonitis vs pneumonia  P:   Continue abx, D5/7 Completed tamiflu  ENDOCRINE A:   Mild hyperglycemia - in setting of steroids  P:   Trend glucose on BMP   FAMILY  - Updates: Daughter updated at bedside am 2/23.  Work note provided for daughter.  Transition patient out of ICU to SDU, TRH.    Canary Brim, NP-C  Pulmonary & Critical Care Pgr: [phone] or if no answer [phone] 09/14/2017, 8:52 AM

## 2017-09-15 ENCOUNTER — Inpatient Hospital Stay (HOSPITAL_COMMUNITY): Payer: Federal, State, Local not specified - PPO

## 2017-09-15 DIAGNOSIS — R945 Abnormal results of liver function studies: Secondary | ICD-10-CM | POA: Diagnosis not present

## 2017-09-15 DIAGNOSIS — R0602 Shortness of breath: Secondary | ICD-10-CM | POA: Diagnosis not present

## 2017-09-15 DIAGNOSIS — J189 Pneumonia, unspecified organism: Secondary | ICD-10-CM | POA: Diagnosis not present

## 2017-09-15 DIAGNOSIS — J9601 Acute respiratory failure with hypoxia: Secondary | ICD-10-CM | POA: Diagnosis not present

## 2017-09-15 DIAGNOSIS — I2510 Atherosclerotic heart disease of native coronary artery without angina pectoris: Secondary | ICD-10-CM

## 2017-09-15 DIAGNOSIS — I2583 Coronary atherosclerosis due to lipid rich plaque: Secondary | ICD-10-CM

## 2017-09-15 LAB — CBC
HCT: 36.9 % — ABNORMAL LOW (ref 39.0–52.0)
HEMOGLOBIN: 12.3 g/dL — AB (ref 13.0–17.0)
MCH: 28.9 pg (ref 26.0–34.0)
MCHC: 33.3 g/dL (ref 30.0–36.0)
MCV: 86.8 fL (ref 78.0–100.0)
Platelets: 262 10*3/uL (ref 150–400)
RBC: 4.25 MIL/uL (ref 4.22–5.81)
RDW: 14.9 % (ref 11.5–15.5)
WBC: 12.6 10*3/uL — AB (ref 4.0–10.5)

## 2017-09-15 LAB — BASIC METABOLIC PANEL
ANION GAP: 8 (ref 5–15)
BUN: 25 mg/dL — ABNORMAL HIGH (ref 6–20)
CHLORIDE: 102 mmol/L (ref 101–111)
CO2: 28 mmol/L (ref 22–32)
Calcium: 8.2 mg/dL — ABNORMAL LOW (ref 8.9–10.3)
Creatinine, Ser: 0.82 mg/dL (ref 0.61–1.24)
Glucose, Bld: 162 mg/dL — ABNORMAL HIGH (ref 65–99)
POTASSIUM: 4.9 mmol/L (ref 3.5–5.1)
SODIUM: 138 mmol/L (ref 135–145)

## 2017-09-15 MED ORDER — CARVEDILOL 6.25 MG PO TABS
6.2500 mg | ORAL_TABLET | Freq: Two times a day (BID) | ORAL | Status: DC
  Administered 2017-09-15 – 2017-09-21 (×12): 6.25 mg via ORAL
  Filled 2017-09-15 (×12): qty 1

## 2017-09-15 NOTE — Progress Notes (Signed)
PROGRESS NOTE    Jared Morse  YFV:[id number] DOB: October 31, 1960 DOA: 09/11/2017 PCP: Patient, No Pcp Per   Brief Narrative: Jared Morse is a 57 y.o. malewithhistory of CAD status post stenting in 2014 who was diagnosed with the flu recently. He did not receive Tamiflu as his physician stated that he was out of the window. He was eventually started on Augmentin and Prednisone as his symptoms did not improve. He finished the Prednisone 1 day prior to admission. His symptoms still did not improve but actually worsened and he presented to the ER with respiratory distress and hypoxia. He has required BiPAP and HFNC to achieve adequate oxygen saturations. He was started on treatment for CAP with Ceftriaxone and azithromycin.   Assessment & Plan:   Principal Problem:   Acute respiratory failure with hypoxia (HCC) Active Problems:   CAD (coronary artery disease)   Multifocal pneumonia   Elevated LFTs   Elevated troponin   Multifocal pneumonia Influenza infection Sputum culture unremarkable. Blood cultures (2/20) no growth to date. Afebrile. Never started on Tamiflu -Continue Ceftriaxon/azithromycin -Pulm recommendations: Solu-medrol  Acute respiratory failure with hypoxia Secondary to above problems. Down to 11L via HFNC -Wean O2 as able -BIPAP qhs  CAD Cardiology consulted initially for mild troponin elevation. Recommended outpatient follow-up. -Continue aspirin  Essential hypertension Patient on Coreg -Decrease Coreg secondary to below  Bradycardia Sinus bradycardia in setting of Coreg -Decrease Coreg to 6.25 mg BID  Elevated LFTs Likely secondary to influenza infection. -repeat CMP   DVT prophylaxis: Lovenox Code Status:   Code Status: Full Code Family Communication: Wife at bedside Disposition Plan: Discharge when oxygen weaned   Consultants:   Pulmonology  Procedures:   Bipap QHS  Antimicrobials:  Ceftriaxone (2/20>>  Azithromycin  (2/20>>2/24)   Subjective: No chest pain or dyspnea at rest.  Objective: Vitals:   09/15/17 0751 09/15/17 0812 09/15/17 1200 09/15/17 1325  BP: 140/70  130/70   Pulse:      Resp: (!) 22  17   Temp: 98.4 F (36.9 C)  98.8 F (37.1 C)   TempSrc: Axillary  Oral   SpO2: 98% 98% 95% 94%  Weight:      Height:        Intake/Output Summary (Last 24 hours) at 09/15/2017 1335 Last data filed at 09/14/2017 2200 Gross per 24 hour  Intake 480 ml  Output -  Net 480 ml   Filed Weights   09/11/17 1122 09/11/17 2238  Weight: 119.3 kg (263 lb 0.1 oz) 119.6 kg (263 lb 10.7 oz)    Examination:  General exam: Appears calm and comfortable Respiratory system: Diffuse rales with diminished breath sounds. Cardiovascular system: S1 & S2 heard, RRR. No murmurs, rubs, gallops or clicks. Gastrointestinal system: Abdomen is nondistended, soft and nontender. No organomegaly or masses felt. Normal bowel sounds heard. Central nervous system: Alert and oriented. No focal neurological deficits. Extremities: No edema. No calf tenderness Skin: No cyanosis. No rashes Psychiatry: Judgement and insight appear normal. Mood & affect appropriate.     Data Reviewed: I have personally reviewed following labs and imaging studies  CBC: Recent Labs  Lab 09/11/17 1140 09/12/17 0257 09/15/17 0208  WBC 8.7 7.2 12.6*  NEUTROABS 7.3 5.7  --   HGB 13.6 12.4* 12.3*  HCT 38.9* 36.6* 36.9*  MCV 83.3 85.3 86.8  PLT 216 209 262   Basic Metabolic Panel: Recent Labs  Lab 09/11/17 1140 09/12/17 0257 09/12/17 1844 09/15/17 0208  NA 135 138 137 138  K  4.1 4.0 4.4 4.9  CL 102 107 105 102  CO2 23 22 24 28   GLUCOSE 115* 110* 140* 162*  BUN 25* 21* 15 25*  CREATININE 0.80 0.84 0.73 0.82  CALCIUM 8.2* 7.7* 7.7* 8.2*   GFR: Estimated Creatinine Clearance: 130 mL/min (by C-G formula based on SCr of 0.82 mg/dL). Liver Function Tests: Recent Labs  Lab 09/11/17 1140 09/12/17 0257 09/12/17 1844  AST 119* 93*  118*  ALT 142* 127* 198*  ALKPHOS 75 65 79  BILITOT 1.1 1.4* 0.8  PROT 7.1 6.0* 6.1*  ALBUMIN 3.0* 2.4* 2.5*   No results for input(s): LIPASE, AMYLASE in the last 168 hours. No results for input(s): AMMONIA in the last 168 hours. Coagulation Profile: Recent Labs  Lab 09/12/17 0257  INR 1.13   Cardiac Enzymes: Recent Labs  Lab 09/11/17 1140 09/12/17 0257 09/12/17 0601 09/12/17 1241  TROPONINI 0.05* 0.03* 0.03* <0.03   BNP (last 3 results) No results for input(s): PROBNP in the last 8760 hours. HbA1C: No results for input(s): HGBA1C in the last 72 hours. CBG: Recent Labs  Lab 09/12/17 1550  GLUCAP 166*   Lipid Profile: No results for input(s): CHOL, HDL, LDLCALC, TRIG, CHOLHDL, LDLDIRECT in the last 72 hours. Thyroid Function Tests: No results for input(s): TSH, T4TOTAL, FREET4, T3FREE, THYROIDAB in the last 72 hours. Anemia Panel: No results for input(s): VITAMINB12, FOLATE, FERRITIN, TIBC, IRON, RETICCTPCT in the last 72 hours. Sepsis Labs: Recent Labs  Lab 09/11/17 1144 09/11/17 1350 09/12/17 0052 09/12/17 0257  PROCALCITON  --   --   --  <0.10  LATICACIDVEN 1.91* 1.63 1.2 0.9    Recent Results (from the past 240 hour(s))  Culture, blood (routine x 2)     Status: None (Preliminary result)   Collection Time: 09/11/17 11:30 AM  Result Value Ref Range Status   Specimen Description   Final    BLOOD RIGHT ANTECUBITAL Performed at [facility], [address] [place] [address] [place], [place] [address]    Special Requests   Final    BOTTLES DRAWN AEROBIC AND ANAEROBIC Blood Culture adequate volume Performed at [facility], [address] [place], [place] [address]    Culture   Final    NO GROWTH 3 DAYS Performed at [facility], [address] [place], [place] [address]    Report Status PENDING  Incomplete  Culture, blood (routine x 2)     Status: None (Preliminary result)   Collection Time: 09/11/17 11:45 AM  Result Value Ref Range  Status   Specimen Description   Final    BLOOD BLOOD RIGHT HAND Performed at [facility], [address] [place], [place] [address]    Special Requests   Final    BOTTLES DRAWN AEROBIC AND ANAEROBIC Blood Culture adequate volume Performed at [facility], [address] [place], [place] [address]    Culture   Final    NO GROWTH 3 DAYS Performed at [facility], [address], [place], [place] [address]    Report Status PENDING  Incomplete  MRSA PCR Screening     Status: None   Collection Time: 09/11/17 10:20 PM  Result Value Ref Range Status   MRSA by PCR NEGATIVE NEGATIVE Final    Comment:        The GeneXpert MRSA Assay (FDA approved for NASAL specimens only), is one component of a comprehensive MRSA colonization surveillance program. It  is not intended to diagnose MRSA infection nor to guide or monitor treatment for MRSA infections. Performed at [facility], [address] [place], [place] [address]   Culture, sputum-assessment     Status: None   Collection Time: 09/12/17 12:36 AM  Result Value Ref Range Status   Specimen Description EXPECTORATED SPUTUM  Final   Special Requests NONE  Final   Sputum evaluation   Final    THIS SPECIMEN IS ACCEPTABLE FOR SPUTUM CULTURE Performed at [facility], [address], [place], [place] [address]    Report Status 09/12/2017 FINAL  Final  Culture, respiratory (NON-Expectorated)     Status: None   Collection Time: 09/12/17 12:36 AM  Result Value Ref Range Status   Specimen Description EXPECTORATED SPUTUM  Final   Special Requests NONE Reflexed from [id number]  Final   Gram Stain   Final    NO WBC SEEN RARE SQUAMOUS EPITHELIAL CELLS PRESENT FEW GRAM NEGATIVE RODS RARE GRAM POSITIVE COCCI IN CLUSTERS    Culture   Final    Consistent with normal respiratory flora. Performed at [facility], [address] [place], [place] [address]    Report Status 09/14/2017 FINAL  Final   Respiratory Panel by PCR     Status: Abnormal   Collection Time: 09/12/17 12:40 AM  Result Value Ref Range Status   Adenovirus NOT DETECTED NOT DETECTED Final   Coronavirus 229E NOT DETECTED NOT DETECTED Final   Coronavirus HKU1 NOT DETECTED NOT DETECTED Final   Coronavirus NL63 NOT DETECTED NOT DETECTED Final   Coronavirus OC43 NOT DETECTED NOT DETECTED Final   Metapneumovirus NOT DETECTED NOT DETECTED Final   Rhinovirus / Enterovirus NOT DETECTED NOT DETECTED Final   Influenza A DETECTED (A) NOT DETECTED Final   Influenza B NOT DETECTED NOT DETECTED Final   Parainfluenza Virus 1 NOT DETECTED NOT DETECTED Final   Parainfluenza Virus 2 NOT DETECTED NOT DETECTED Final   Parainfluenza Virus 3 NOT DETECTED NOT DETECTED Final   Parainfluenza Virus 4 NOT DETECTED NOT DETECTED Final   Respiratory Syncytial Virus NOT DETECTED NOT DETECTED Final   Bordetella pertussis NOT DETECTED NOT DETECTED Final   Chlamydophila pneumoniae NOT DETECTED NOT DETECTED Final   Mycoplasma pneumoniae NOT DETECTED NOT DETECTED Final    Comment: Performed at [facility], [address] [place] [address] [place], [place] [address]         Radiology Studies: Dg Chest Port 1 View  Result Date: 09/15/2017 CLINICAL DATA:  Follow-up pneumonia and shortness of breath. EXAM: PORTABLE CHEST 1 VIEW COMPARISON:  09/11/2017 FINDINGS: Heart size is normal. Widespread patchy bilateral pulmonary infiltrates persist, left more than right, consistent with bronchopneumonia. No worsening or new finding. IMPRESSION: No change allowing for technical differences. Widespread patchy bilateral bronchopneumonia left worse than right. Electronically Signed   By: Paulina Fusi M.D.   On: 09/15/2017 06:40        Scheduled Meds: . aspirin EC  81 mg Oral Daily  . carvedilol  12.5 mg Oral BID WC  . chlorhexidine  15 mL Mouth Rinse BID  . chlorpheniramine-HYDROcodone  5 mL Oral Q12H  . docusate sodium  100 mg Oral BID  . enoxaparin (LOVENOX)  injection  40 mg Subcutaneous Q24H  . feeding supplement (ENSURE ENLIVE)  237 mL Oral BID BM  . ipratropium-albuterol  3 mL Nebulization Q6H  . mouth rinse  15 mL Mouth Rinse BID  . methylPREDNISolone (SOLU-MEDROL) injection  40 mg  Intravenous Q12H  . pneumococcal 23 valent vaccine  0.5 mL Intramuscular Tomorrow-1000   Continuous Infusions: . azithromycin 500 mg (09/15/17 1218)  . cefTRIAXone (ROCEPHIN)  IV 2 g (09/15/17 1218)     LOS: 4 days     Jacquelin Hawking, MD [facility] 09/15/2017, 1:35 PM Pager: [phone]  If 7PM-7AM, please contact night-coverage [email or web address] Password [email or web address] 09/15/2017, 1:35 PM

## 2017-09-16 ENCOUNTER — Inpatient Hospital Stay (HOSPITAL_COMMUNITY): Payer: Federal, State, Local not specified - PPO

## 2017-09-16 DIAGNOSIS — J111 Influenza due to unidentified influenza virus with other respiratory manifestations: Secondary | ICD-10-CM

## 2017-09-16 DIAGNOSIS — R7401 Elevation of levels of liver transaminase levels: Secondary | ICD-10-CM

## 2017-09-16 DIAGNOSIS — K760 Fatty (change of) liver, not elsewhere classified: Secondary | ICD-10-CM | POA: Diagnosis not present

## 2017-09-16 DIAGNOSIS — R740 Nonspecific elevation of levels of transaminase and lactic acid dehydrogenase [LDH]: Secondary | ICD-10-CM | POA: Diagnosis not present

## 2017-09-16 DIAGNOSIS — I2510 Atherosclerotic heart disease of native coronary artery without angina pectoris: Secondary | ICD-10-CM | POA: Diagnosis not present

## 2017-09-16 DIAGNOSIS — R001 Bradycardia, unspecified: Secondary | ICD-10-CM

## 2017-09-16 DIAGNOSIS — J9601 Acute respiratory failure with hypoxia: Secondary | ICD-10-CM | POA: Diagnosis not present

## 2017-09-16 DIAGNOSIS — J189 Pneumonia, unspecified organism: Secondary | ICD-10-CM | POA: Diagnosis not present

## 2017-09-16 DIAGNOSIS — R945 Abnormal results of liver function studies: Secondary | ICD-10-CM | POA: Diagnosis not present

## 2017-09-16 LAB — COMPREHENSIVE METABOLIC PANEL
ALBUMIN: 2.5 g/dL — AB (ref 3.5–5.0)
ALT: 271 U/L — ABNORMAL HIGH (ref 17–63)
ANION GAP: 9 (ref 5–15)
AST: 45 U/L — ABNORMAL HIGH (ref 15–41)
Alkaline Phosphatase: 74 U/L (ref 38–126)
BILIRUBIN TOTAL: 0.9 mg/dL (ref 0.3–1.2)
BUN: 19 mg/dL (ref 6–20)
CO2: 27 mmol/L (ref 22–32)
Calcium: 8.3 mg/dL — ABNORMAL LOW (ref 8.9–10.3)
Chloride: 100 mmol/L — ABNORMAL LOW (ref 101–111)
Creatinine, Ser: 0.68 mg/dL (ref 0.61–1.24)
GFR calc Af Amer: >60 mL/min (ref >60)
GFR calc non Af Amer: >60 mL/min (ref >60)
GLUCOSE: 179 mg/dL — AB (ref 65–99)
POTASSIUM: 4.9 mmol/L (ref 3.5–5.1)
Sodium: 136 mmol/L (ref 135–145)
TOTAL PROTEIN: 6.1 g/dL — AB (ref 6.5–8.1)

## 2017-09-16 LAB — CULTURE, BLOOD (ROUTINE X 2)
Culture: NO GROWTH
Culture: NO GROWTH
Special Requests: ADEQUATE
Special Requests: ADEQUATE

## 2017-09-16 LAB — CBC
HEMATOCRIT: 37.7 % — AB (ref 39.0–52.0)
HEMOGLOBIN: 12.6 g/dL — AB (ref 13.0–17.0)
MCH: 28.8 pg (ref 26.0–34.0)
MCHC: 33.4 g/dL (ref 30.0–36.0)
MCV: 86.3 fL (ref 78.0–100.0)
Platelets: 269 10*3/uL (ref 150–400)
RBC: 4.37 MIL/uL (ref 4.22–5.81)
RDW: 14.4 % (ref 11.5–15.5)
WBC: 11.3 10*3/uL — AB (ref 4.0–10.5)

## 2017-09-16 NOTE — Progress Notes (Signed)
PULMONARY / CRITICAL CARE MEDICINE   Name: Jared Morse MRN: [id number] DOB: 1960/08/19    ADMISSION DATE:  09/11/2017 CONSULTATION DATE: 09/12/2017  REFERRING MD:  [facility]  CHIEF COMPLAINT:  Chest pain dry cough  HISTORY OF PRESENT ILLNESS:  57 year old male, remote smoker, with PMH of asthma who was admitted 2/20 with reports of a 10-day history of increasing malaise, intermittent fevers, dry nonproductive cough.  He does note chest pain and does have a history of having a stent in 2014 but he says this is not usual chest pain.  He went to his primary care physician and was given steroids and was reported to have a positive flu but was too far out for treatment with Tamiflu.  Despite treatment he continued to worsen especially when his steroids were completed.  He went to the emergency room at [facility] and was treated with a nebulizer treatment he presented to the emergency room on 4/20 after being seen by his primary care physician and CT scan revealed extensive bilateral airspace disease.  He was briefly placed on BiPAP but did not tolerate well but does not appear to be in acute respiratory distress at the time of this examination.  He is obviously weak and some mild disorientation.  Pulmonary critical care asked to evaluate 2/22.  He is on broad-spectrum antibiotics at this time, nasal cannula with O2 sats 92%.  He is sitting up in the bed and is mildly confused.  His wife answers most of his questions for him but he does not seem to be short of breath with talking.  SUBJECTIVE:  No acute events overnight.  Increased O2 needs with activity this am.  Qhs bipap but has not needed during day.  No subjective dyspnea or increased WOB.    VITAL SIGNS: BP 114/66 (BP Location: Left Arm)   Pulse 62   Temp 98.6 F (37 C) (Axillary)   Resp 12   Ht 5\' 9"  (1.753 m)   Wt 119.6 kg (263 lb 10.7 oz)   SpO2 95%   BMI 38.94 kg/m   HEMODYNAMICS:    VENTILATOR SETTINGS: FiO2 (%):  [50 %] 50 %  INTAKE  / OUTPUT: I/O last 3 completed shifts: In: 1540 [P.O.:840; IV Piggyback:700] Out: 3200 [Urine:3200]  PHYSICAL EXAMINATION: General: well developed adult male in NAD, OOB in chair  HEENT: MM pink/moist PSY: calm/appropriate  Neuro: AAOx4, MAE  CV: s1s2 rrr, no m/r/g PULM: even/non-labored, lungs bilaterally few fine crackles R>L  CH:YIFO, non-tender, bsx4 active  Extremities: warm/dry, no edema  Skin: no rashes or lesions  LABS:  BMET Recent Labs  Lab 09/12/17 1844 09/15/17 0208 09/16/17 0243  NA 137 138 136  K 4.4 4.9 4.9  CL 105 102 100*  CO2 24 28 27   BUN 15 25* 19  CREATININE 0.73 0.82 0.68  GLUCOSE 140* 162* 179*    Electrolytes Recent Labs  Lab 09/12/17 1844 09/15/17 0208 09/16/17 0243  CALCIUM 7.7* 8.2* 8.3*    CBC Recent Labs  Lab 09/12/17 0257 09/15/17 0208 09/16/17 0243  WBC 7.2 12.6* 11.3*  HGB 12.4* 12.3* 12.6*  HCT 36.6* 36.9* 37.7*  PLT 209 262 269    Coag's Recent Labs  Lab 09/12/17 0257  APTT 31  INR 1.13    Sepsis Markers Recent Labs  Lab 09/11/17 1350 09/12/17 0052 09/12/17 0257  LATICACIDVEN 1.63 1.2 0.9  PROCALCITON  --   --  <0.10    ABG Recent Labs  Lab 09/13/17 0521  PHART 7.417  PCO2ART 42.7  PO2ART 73.3*    Liver Enzymes Recent Labs  Lab 09/12/17 0257 09/12/17 1844 09/16/17 0243  AST 93* 118* 45*  ALT 127* 198* 271*  ALKPHOS 65 79 74  BILITOT 1.4* 0.8 0.9  ALBUMIN 2.4* 2.5* 2.5*    Cardiac Enzymes Recent Labs  Lab 09/12/17 0257 09/12/17 0601 09/12/17 1241  TROPONINI 0.03* 0.03* <0.03    Glucose Recent Labs  Lab 09/12/17 1550  GLUCAP 166*    Imaging US Abdomen Limited Ruq  Result Date: 09/16/2017 CLINICAL DATA:  Elevated LFTs. EXAM: ULTRASOUND ABDOMEN LIMITED RIGHT UPPER QUADRANT COMPARISON:  None. FINDINGS: Gallbladder: No gallstones or wall thickening visualized. No sonographic Murphy sign noted by sonographer. Common bile duct: Diameter: 5.5 mm, normal. Liver: No focal lesion  identified. Increased in parenchymal echogenicity. Portal vein is patent on color Doppler imaging with normal direction of blood flow towards the liver. IMPRESSION: 1. No acute abnormality. 2. Hepatic steatosis. Electronically Signed   By: Obie Dredge M.D.   On: 09/16/2017 10:01     STUDIES:  2/20 CT Chest >> bilateral airspace disease consistent with multifocal pneumonia  CULTURES: Sputum 2/20 >> normal flora  BCx2 2/20 >>   ANTIBIOTICS: Azithro 2/20 >>  Rocephin 2/20 >>    SIGNIFICANT EVENTS: 2/20  Admit   LINES/TUBES:   DISCUSSION: 57 y/o M, remote smoker, with hx of asthma admitted with malaise, fever and non-productive cough.  Seen by PCP and treated with Tamiflu for positive flu swab.  Despite therapy, he continued to worsen.  Admitted 2/20 with a CT chest showing extensive bilateral airspace disease. Intermittently required BiPAP.  Slowly improving.     ASSESSMENT / PLAN:  PULMONARY A: Acute hypoxic respiratory failure  Extensive bilateral airspace disease consistent with multifocal pneumonia Reported recent positive flu antigen Recent treatment with steroids Cough - nonproductive  P:   O2 to support sats > 90% - increased O2 needs today with activity but overall improving slowly  Intermittent BiPAP for work of breathing  ABX as above Continue Solu-medrol 40 mg IV QD with taper to off  Follow intermittent CXR  PT efforts / mobilize  Duoneb Q6  PRN tussionex for cough   CARDIOVASCULAR A:  CAD s/p Stent 2014 Chest Pain - negative troponin, likely pleuritic  P: SDU monitoring  Continue home coreg   GASTROINTESTINAL A:   Mild elevation of LFTs P:   Trend LFT's   HEMATOLOGIC A:   No acute issues P:  Lovenox for DVT prophylaxis   INFECTIOUS A:   Influenza  Diffuse Bilateral Infiltrates - viral pneumonitis vs pneumonia  P:   Continue abx, D6/7 Completed tamiflu  ENDOCRINE A:   Mild hyperglycemia - in setting of steroids  P:   Trend  glucose on BMP   FAMILY  - Updates: pt updated 2/25.   Keep in SDU for now.  PCCM will cont to f/u intermittently.   Dirk Dress, NP 09/16/2017  11:46 AM Pager: [phone] or [phone]

## 2017-09-16 NOTE — Progress Notes (Signed)
PROGRESS NOTE    Jared Morse  ZOX:[id number] DOB: 12/19/1960 DOA: 09/11/2017 PCP: Patient, No Pcp Per   Brief Narrative: Jared Morse is a 57 y.o. malewithhistory of CAD status post stenting in 2014 who was diagnosed with the flu recently. He did not receive Tamiflu as his physician stated that he was out of the window. He was eventually started on Augmentin and Prednisone as his symptoms did not improve. He finished the Prednisone 1 day prior to admission. His symptoms still did not improve but actually worsened and he presented to the ER with respiratory distress and hypoxia. He has required BiPAP and HFNC to achieve adequate oxygen saturations. He was started on treatment for CAP with Ceftriaxone and azithromycin.   Assessment & Plan:   Principal Problem:   Acute respiratory failure with hypoxia (HCC) Active Problems:   CAD (coronary artery disease)   Multifocal pneumonia   Elevated LFTs   Elevated troponin   Multifocal pneumonia Influenza infection Sputum culture unremarkable. Blood cultures (2/20) no growth to date. Afebrile. Never started on Tamiflu -Continue Ceftriaxon/azithromycin -Pulm recommendations: Solu-medrol taper  Acute respiratory failure with hypoxia Secondary to above problems. Down to 10L via HFNC yesterday. Continues BiPAP overnight -Wean O2 as able -BIPAP qhs  CAD Cardiology consulted initially for mild troponin elevation. Recommended outpatient follow-up. -Continue aspirin  Essential hypertension Patient on Coreg -Coreg dose per below  Bradycardia Sinus bradycardia in setting of Coreg. Continued for another night with rates in 40s during my personal review of telemetry. Patient is asymptomatic. -Continue Coreg to 6.25 mg BID  Elevated LFTs Likely secondary to influenza infection. Still elevated. Hepatitis panel negative. No associated elevated alkaline phosphatase. -[place] of RUQ to assess liver   DVT prophylaxis: Lovenox Code Status:   Code  Status: Full Code Family Communication: None at bedside Disposition Plan: Discharge when oxygen weaned   Consultants:   Pulmonology  Procedures:   Bipap QHS  Antimicrobials:  Ceftriaxone (2/20>>  Azithromycin (2/20>>2/24)   Subjective: No chest pain or dyspnea. Had some erythema of bilateral legs that was marked, which resolved.  Objective: Vitals:   09/16/17 0100 09/16/17 0337 09/16/17 0409 09/16/17 0714  BP:   114/66   Pulse:      Resp: 17 16 12    Temp:   98.7 F (37.1 C) 98.6 F (37 C)  TempSrc:   Axillary Axillary  SpO2: 98%  95%   Weight:      Height:        Intake/Output Summary (Last 24 hours) at 09/16/2017 0804 Last data filed at 09/16/2017 0500 Gross per 24 hour  Intake 940 ml  Output 2500 ml  Net -1560 ml   Filed Weights   09/11/17 1122 09/11/17 2238  Weight: 119.3 kg (263 lb 0.1 oz) 119.6 kg (263 lb 10.7 oz)    Examination:  General exam: Appears calm and comfortable Respiratory system: mildly diffuse rales with better air movement on anterior auscultation Cardiovascular system: S1 & S2 heard, RRR. No murmurs, rubs, gallops or clicks. Gastrointestinal system: Abdomen is nondistended, soft and nontender. No organomegaly or masses felt. Normal bowel sounds heard. Central nervous system: Alert and oriented. No focal neurological deficits. Extremities: No edema. No calf tenderness Skin: No cyanosis. No rashes Psychiatry: Judgement and insight appear normal. Mood & affect appropriate.     Data Reviewed: I have personally reviewed following labs and imaging studies  CBC: Recent Labs  Lab 09/11/17 1140 09/12/17 0257 09/15/17 0208 09/16/17 0243  WBC 8.7 7.2 12.6* 11.3*  NEUTROABS 7.3 5.7  --   --   HGB 13.6 12.4* 12.3* 12.6*  HCT 38.9* 36.6* 36.9* 37.7*  MCV 83.3 85.3 86.8 86.3  PLT 216 209 262 269   Basic Metabolic Panel: Recent Labs  Lab 09/11/17 1140 09/12/17 0257 09/12/17 1844 09/15/17 0208 09/16/17 0243  NA 135 138 137 138  136  K 4.1 4.0 4.4 4.9 4.9  CL 102 107 105 102 100*  CO2 23 22 24 28 27   GLUCOSE 115* 110* 140* 162* 179*  BUN 25* 21* 15 25* 19  CREATININE 0.80 0.84 0.73 0.82 0.68  CALCIUM 8.2* 7.7* 7.7* 8.2* 8.3*   GFR: Estimated Creatinine Clearance: 133.3 mL/min (by C-G formula based on SCr of 0.68 mg/dL). Liver Function Tests: Recent Labs  Lab 09/11/17 1140 09/12/17 0257 09/12/17 1844 09/16/17 0243  AST 119* 93* 118* 45*  ALT 142* 127* 198* 271*  ALKPHOS 75 65 79 74  BILITOT 1.1 1.4* 0.8 0.9  PROT 7.1 6.0* 6.1* 6.1*  ALBUMIN 3.0* 2.4* 2.5* 2.5*   No results for input(s): LIPASE, AMYLASE in the last 168 hours. No results for input(s): AMMONIA in the last 168 hours. Coagulation Profile: Recent Labs  Lab 09/12/17 0257  INR 1.13   Cardiac Enzymes: Recent Labs  Lab 09/11/17 1140 09/12/17 0257 09/12/17 0601 09/12/17 1241  TROPONINI 0.05* 0.03* 0.03* <0.03   BNP (last 3 results) No results for input(s): PROBNP in the last 8760 hours. HbA1C: No results for input(s): HGBA1C in the last 72 hours. CBG: Recent Labs  Lab 09/12/17 1550  GLUCAP 166*   Lipid Profile: No results for input(s): CHOL, HDL, LDLCALC, TRIG, CHOLHDL, LDLDIRECT in the last 72 hours. Thyroid Function Tests: No results for input(s): TSH, T4TOTAL, FREET4, T3FREE, THYROIDAB in the last 72 hours. Anemia Panel: No results for input(s): VITAMINB12, FOLATE, FERRITIN, TIBC, IRON, RETICCTPCT in the last 72 hours. Sepsis Labs: Recent Labs  Lab 09/11/17 1144 09/11/17 1350 09/12/17 0052 09/12/17 0257  PROCALCITON  --   --   --  <0.10  LATICACIDVEN 1.91* 1.63 1.2 0.9    Recent Results (from the past 240 hour(s))  Culture, blood (routine x 2)     Status: None (Preliminary result)   Collection Time: 09/11/17 11:30 AM  Result Value Ref Range Status   Specimen Description   Final    BLOOD RIGHT ANTECUBITAL Performed at [facility], [address] [place], [place] [address]    Special Requests    Final    BOTTLES DRAWN AEROBIC AND ANAEROBIC Blood Culture adequate volume Performed at [facility], [address] [place], [place] [address]    Culture   Final    NO GROWTH 4 DAYS Performed at [facility], [address] [place], [place] [address]    Report Status PENDING  Incomplete  Culture, blood (routine x 2)     Status: None (Preliminary result)   Collection Time: 09/11/17 11:45 AM  Result Value Ref Range Status   Specimen Description   Final    BLOOD BLOOD RIGHT HAND Performed at [facility], [address] [place], [place] [address]    Special Requests   Final    BOTTLES DRAWN AEROBIC AND ANAEROBIC Blood Culture adequate volume Performed at [facility], [address] [place], [place] [address]    Culture   Final    NO GROWTH 4 DAYS Performed at [facility], [address], [place], [place] [address]    Report Status PENDING  Incomplete  MRSA PCR Screening     Status: None   Collection Time: 09/11/17 10:20 PM  Result Value Ref Range Status   MRSA by PCR NEGATIVE NEGATIVE Final    Comment:        The GeneXpert MRSA Assay (FDA approved for NASAL specimens only), is one component of a comprehensive MRSA colonization surveillance program. It is not intended to diagnose MRSA infection nor to guide or monitor treatment for MRSA infections. Performed at [facility], [address], [place], [place] [address]   Culture, sputum-assessment     Status: None   Collection Time: 09/12/17 12:36 AM  Result Value Ref Range Status   Specimen Description EXPECTORATED SPUTUM  Final   Special Requests NONE  Final   Sputum evaluation   Final    THIS SPECIMEN IS ACCEPTABLE FOR SPUTUM CULTURE Performed at [facility], [address], [place], [place] [address]    Report Status 09/12/2017 FINAL  Final  Culture, respiratory (NON-Expectorated)     Status: None   Collection Time: 09/12/17 12:36 AM  Result Value Ref  Range Status   Specimen Description EXPECTORATED SPUTUM  Final   Special Requests NONE Reflexed from [id number]  Final   Gram Stain   Final    NO WBC SEEN RARE SQUAMOUS EPITHELIAL CELLS PRESENT FEW GRAM NEGATIVE RODS RARE GRAM POSITIVE COCCI IN CLUSTERS    Culture   Final    Consistent with normal respiratory flora. Performed at [facility], [address] [place], [place] [address]    Report Status 09/14/2017 FINAL  Final  Respiratory Panel by PCR     Status: Abnormal   Collection Time: 09/12/17 12:40 AM  Result Value Ref Range Status   Adenovirus NOT DETECTED NOT DETECTED Final   Coronavirus 229E NOT DETECTED NOT DETECTED Final   Coronavirus HKU1 NOT DETECTED NOT DETECTED Final   Coronavirus NL63 NOT DETECTED NOT DETECTED Final   Coronavirus OC43 NOT DETECTED NOT DETECTED Final   Metapneumovirus NOT DETECTED NOT DETECTED Final   Rhinovirus / Enterovirus NOT DETECTED NOT DETECTED Final   Influenza A DETECTED (A) NOT DETECTED Final   Influenza B NOT DETECTED NOT DETECTED Final   Parainfluenza Virus 1 NOT DETECTED NOT DETECTED Final   Parainfluenza Virus 2 NOT DETECTED NOT DETECTED Final   Parainfluenza Virus 3 NOT DETECTED NOT DETECTED Final   Parainfluenza Virus 4 NOT DETECTED NOT DETECTED Final   Respiratory Syncytial Virus NOT DETECTED NOT DETECTED Final   Bordetella pertussis NOT DETECTED NOT DETECTED Final   Chlamydophila pneumoniae NOT DETECTED NOT DETECTED Final   Mycoplasma pneumoniae NOT DETECTED NOT DETECTED Final    Comment: Performed at [facility], [address], [place], [place] [address]         Radiology Studies: Dg Chest Port 1 View  Result Date: 09/15/2017 CLINICAL DATA:  Follow-up pneumonia and shortness of breath. EXAM: PORTABLE CHEST 1 VIEW COMPARISON:  09/11/2017 FINDINGS: Heart size is normal. Widespread patchy bilateral pulmonary infiltrates persist, left more than right, consistent with bronchopneumonia. No worsening or new finding.  IMPRESSION: No change allowing for technical differences. Widespread patchy bilateral bronchopneumonia left worse than right. Electronically Signed   By: Paulina Fusi M.D.   On: 09/15/2017 06:40        Scheduled Meds: . aspirin EC  81 mg Oral Daily  . carvedilol  6.25 mg Oral  BID WC  . chlorhexidine  15 mL Mouth Rinse BID  . chlorpheniramine-HYDROcodone  5 mL Oral Q12H  . docusate sodium  100 mg Oral BID  . enoxaparin (LOVENOX) injection  40 mg Subcutaneous Q24H  . feeding supplement (ENSURE ENLIVE)  237 mL Oral BID BM  . ipratropium-albuterol  3 mL Nebulization Q6H  . mouth rinse  15 mL Mouth Rinse BID  . methylPREDNISolone (SOLU-MEDROL) injection  40 mg Intravenous Q12H  . pneumococcal 23 valent vaccine  0.5 mL Intramuscular Tomorrow-1000   Continuous Infusions: . azithromycin Stopped (09/15/17 1452)  . cefTRIAXone (ROCEPHIN)  IV Stopped (09/15/17 1453)     LOS: 5 days     Jacquelin Hawking, MD [facility] 09/16/2017, 8:04 AM Pager: [phone]  If 7PM-7AM, please contact night-coverage [email or web address] Password [facility] 09/16/2017, 8:04 AM

## 2017-09-17 DIAGNOSIS — I2510 Atherosclerotic heart disease of native coronary artery without angina pectoris: Secondary | ICD-10-CM | POA: Diagnosis not present

## 2017-09-17 DIAGNOSIS — J189 Pneumonia, unspecified organism: Secondary | ICD-10-CM | POA: Diagnosis not present

## 2017-09-17 DIAGNOSIS — J9601 Acute respiratory failure with hypoxia: Secondary | ICD-10-CM | POA: Diagnosis not present

## 2017-09-17 DIAGNOSIS — R945 Abnormal results of liver function studies: Secondary | ICD-10-CM | POA: Diagnosis not present

## 2017-09-17 MED ORDER — PREMIER PROTEIN SHAKE
11.0000 [oz_av] | Freq: Two times a day (BID) | ORAL | Status: DC
  Administered 2017-09-19 – 2017-09-21 (×2): 11 [oz_av] via ORAL
  Filled 2017-09-17 (×15): qty 325.31

## 2017-09-17 MED ORDER — IPRATROPIUM-ALBUTEROL 0.5-2.5 (3) MG/3ML IN SOLN
3.0000 mL | Freq: Three times a day (TID) | RESPIRATORY_TRACT | Status: DC
  Administered 2017-09-17 – 2017-09-18 (×3): 3 mL via RESPIRATORY_TRACT
  Filled 2017-09-17 (×3): qty 3

## 2017-09-17 MED ORDER — ALBUTEROL SULFATE (2.5 MG/3ML) 0.083% IN NEBU: 2.5000 mg | INHALATION_SOLUTION | RESPIRATORY_TRACT | Status: DC | PRN

## 2017-09-17 NOTE — Progress Notes (Signed)
PROGRESS NOTE    Munachimso Romack  RVI:[id number] DOB: 01-06-1961 DOA: 09/11/2017 PCP: Patient, No Pcp Per   Brief Narrative: Nyjel Gabino is a 57 y.o. malewithhistory of CAD status post stenting in 2014 who was diagnosed with the flu recently. He did not receive Tamiflu as his physician stated that he was out of the window. He was eventually started on Augmentin and Prednisone as his symptoms did not improve. He finished the Prednisone 1 day prior to admission. His symptoms still did not improve but actually worsened and he presented to the ER with respiratory distress and hypoxia. He has required BiPAP and HFNC to achieve adequate oxygen saturations. He was started on treatment for CAP with Ceftriaxone and azithromycin. Influenza positive, not started on Tamiflu   Assessment & Plan:   Principal Problem:   Acute respiratory failure with hypoxia (HCC) Active Problems:   CAD (coronary artery disease)   Multifocal pneumonia   Elevated LFTs   Elevated troponin   Elevated transaminase level   Multifocal pneumonia Influenza infection Sputum culture unremarkable. Blood cultures (2/20) no growth to date. Afebrile. Never started on Tamiflu -Continue Ceftriaxon/azithromycin -Pulm recommendations: Solu-medrol taper  Acute respiratory failure with hypoxia Secondary to above problems. Back up to 12L via HFNC. Continues BiPAP overnight -Wean O2 as able -BIPAP qhs  CAD Cardiology consulted initially for mild troponin elevation. Recommended outpatient follow-up. -Continue aspirin  Essential hypertension Patient on Coreg -Coreg dose per below  Bradycardia Sinus bradycardia in setting of Coreg. Continues to have bradycardia at night but is asymptomatic.  -Continue Coreg to 6.25 mg BID  Elevated LFTs Possibly secondary to influenza infection. Still elevated. Hepatitis panel negative. No associated elevated alkaline phosphatase. Hepatic steatosis on ultrasound.   DVT prophylaxis:  Lovenox Code Status:   Code Status: Full Code Family Communication: None at bedside Disposition Plan: Discharge when oxygen weaned   Consultants:   Pulmonology  Procedures:   Bipap QHS  Antimicrobials:  Ceftriaxone (2/20>>  Azithromycin (2/20>>2/24)   Subjective: No dyspnea or chest pain.   Objective: Vitals:   09/17/17 0354 09/17/17 0715 09/17/17 0743 09/17/17 0830  BP: 123/69   106/68  Pulse:    79  Resp: 13     Temp: 97.9 F (36.6 C) 97.8 F (36.6 C)    TempSrc: Axillary Oral    SpO2: 97%  97%   Weight:      Height:        Intake/Output Summary (Last 24 hours) at 09/17/2017 0921 Last data filed at 09/17/2017 0357 Gross per 24 hour  Intake 720 ml  Output 925 ml  Net -205 ml   Filed Weights   09/11/17 1122 09/11/17 2238  Weight: 119.3 kg (263 lb 0.1 oz) 119.6 kg (263 lb 10.7 oz)    Examination:  General exam: Appears calm and comfortable Respiratory system: Diffuse rales with moderate amount of air movement. No wheezing. No accessory muscle usage. Able to speak in complete sentences. Cardiovascular system: S1 & S2 heard, RRR. No murmurs, rubs, gallops or clicks. Gastrointestinal system: Abdomen is nondistended, soft and nontender. No organomegaly or masses felt. Normal bowel sounds heard. Central nervous system: Alert and oriented. No focal neurological deficits. Extremities: No edema. No calf tenderness Skin: No cyanosis. No rashes Psychiatry: Judgement and insight appear normal. Mood & affect appropriate.     Data Reviewed: I have personally reviewed following labs and imaging studies  CBC: Recent Labs  Lab 09/11/17 1140 09/12/17 0257 09/15/17 0208 09/16/17 0243  WBC 8.7 7.2 12.6*  11.3*  NEUTROABS 7.3 5.7  --   --   HGB 13.6 12.4* 12.3* 12.6*  HCT 38.9* 36.6* 36.9* 37.7*  MCV 83.3 85.3 86.8 86.3  PLT 216 209 262 269   Basic Metabolic Panel: Recent Labs  Lab 09/11/17 1140 09/12/17 0257 09/12/17 1844 09/15/17 0208 09/16/17 0243   NA 135 138 137 138 136  K 4.1 4.0 4.4 4.9 4.9  CL 102 107 105 102 100*  CO2 23 22 24 28 27   GLUCOSE 115* 110* 140* 162* 179*  BUN 25* 21* 15 25* 19  CREATININE 0.80 0.84 0.73 0.82 0.68  CALCIUM 8.2* 7.7* 7.7* 8.2* 8.3*   GFR: Estimated Creatinine Clearance: 133.3 mL/min (by C-G formula based on SCr of 0.68 mg/dL). Liver Function Tests: Recent Labs  Lab 09/11/17 1140 09/12/17 0257 09/12/17 1844 09/16/17 0243  AST 119* 93* 118* 45*  ALT 142* 127* 198* 271*  ALKPHOS 75 65 79 74  BILITOT 1.1 1.4* 0.8 0.9  PROT 7.1 6.0* 6.1* 6.1*  ALBUMIN 3.0* 2.4* 2.5* 2.5*   No results for input(s): LIPASE, AMYLASE in the last 168 hours. No results for input(s): AMMONIA in the last 168 hours. Coagulation Profile: Recent Labs  Lab 09/12/17 0257  INR 1.13   Cardiac Enzymes: Recent Labs  Lab 09/11/17 1140 09/12/17 0257 09/12/17 0601 09/12/17 1241  TROPONINI 0.05* 0.03* 0.03* <0.03   BNP (last 3 results) No results for input(s): PROBNP in the last 8760 hours. HbA1C: No results for input(s): HGBA1C in the last 72 hours. CBG: Recent Labs  Lab 09/12/17 1550  GLUCAP 166*   Lipid Profile: No results for input(s): CHOL, HDL, LDLCALC, TRIG, CHOLHDL, LDLDIRECT in the last 72 hours. Thyroid Function Tests: No results for input(s): TSH, T4TOTAL, FREET4, T3FREE, THYROIDAB in the last 72 hours. Anemia Panel: No results for input(s): VITAMINB12, FOLATE, FERRITIN, TIBC, IRON, RETICCTPCT in the last 72 hours. Sepsis Labs: Recent Labs  Lab 09/11/17 1144 09/11/17 1350 09/12/17 0052 09/12/17 0257  PROCALCITON  --   --   --  <0.10  LATICACIDVEN 1.91* 1.63 1.2 0.9    Recent Results (from the past 240 hour(s))  Culture, blood (routine x 2)     Status: None   Collection Time: 09/11/17 11:30 AM  Result Value Ref Range Status   Specimen Description   Final    BLOOD RIGHT ANTECUBITAL Performed at [facility], [address] [place], [place] [address]    Special Requests    Final    BOTTLES DRAWN AEROBIC AND ANAEROBIC Blood Culture adequate volume Performed at [facility], [address] [place], [place] [address]    Culture   Final    NO GROWTH 5 DAYS Performed at [facility], [address] [place], [place] [address]    Report Status 09/16/2017 FINAL  Final  Culture, blood (routine x 2)     Status: None   Collection Time: 09/11/17 11:45 AM  Result Value Ref Range Status   Specimen Description   Final    BLOOD BLOOD RIGHT HAND Performed at [facility], [address] [place], [place] [address]    Special Requests   Final    BOTTLES DRAWN AEROBIC AND ANAEROBIC Blood Culture adequate volume Performed at [facility], [address] [place], [place] [address]    Culture   Final    NO GROWTH 5 DAYS Performed at [facility], [address], [place], [place] [address]    Report Status 09/16/2017 FINAL  Final  MRSA PCR Screening     Status: None   Collection Time: 09/11/17 10:20 PM  Result Value Ref Range Status   MRSA by PCR NEGATIVE NEGATIVE Final    Comment:        The GeneXpert MRSA Assay (FDA approved for NASAL specimens only), is one component of a comprehensive MRSA colonization surveillance program. It is not intended to diagnose MRSA infection nor to guide or monitor treatment for MRSA infections. Performed at [facility] [place] [facility], [address] [place], [place] [address]   Culture, sputum-assessment     Status: None   Collection Time: 09/12/17 12:36 AM  Result Value Ref Range Status   Specimen Description EXPECTORATED SPUTUM  Final   Special Requests NONE  Final   Sputum evaluation   Final    THIS SPECIMEN IS ACCEPTABLE FOR SPUTUM CULTURE Performed at [facility], [address] [place], [place] [address]    Report Status 09/12/2017 FINAL  Final  Culture, respiratory (NON-Expectorated)     Status: None   Collection Time: 09/12/17 12:36 AM  Result Value Ref Range Status    Specimen Description EXPECTORATED SPUTUM  Final   Special Requests NONE Reflexed from [id number]  Final   Gram Stain   Final    NO WBC SEEN RARE SQUAMOUS EPITHELIAL CELLS PRESENT FEW GRAM NEGATIVE RODS RARE GRAM POSITIVE COCCI IN CLUSTERS    Culture   Final    Consistent with normal respiratory flora. Performed at [facility], [address] [place], [place] [address]    Report Status 09/14/2017 FINAL  Final  Respiratory Panel by PCR     Status: Abnormal   Collection Time: 09/12/17 12:40 AM  Result Value Ref Range Status   Adenovirus NOT DETECTED NOT DETECTED Final   Coronavirus 229E NOT DETECTED NOT DETECTED Final   Coronavirus HKU1 NOT DETECTED NOT DETECTED Final   Coronavirus NL63 NOT DETECTED NOT DETECTED Final   Coronavirus OC43 NOT DETECTED NOT DETECTED Final   Metapneumovirus NOT DETECTED NOT DETECTED Final   Rhinovirus / Enterovirus NOT DETECTED NOT DETECTED Final   Influenza A DETECTED (A) NOT DETECTED Final   Influenza B NOT DETECTED NOT DETECTED Final   Parainfluenza Virus 1 NOT DETECTED NOT DETECTED Final   Parainfluenza Virus 2 NOT DETECTED NOT DETECTED Final   Parainfluenza Virus 3 NOT DETECTED NOT DETECTED Final   Parainfluenza Virus 4 NOT DETECTED NOT DETECTED Final   Respiratory Syncytial Virus NOT DETECTED NOT DETECTED Final   Bordetella pertussis NOT DETECTED NOT DETECTED Final   Chlamydophila pneumoniae NOT DETECTED NOT DETECTED Final   Mycoplasma pneumoniae NOT DETECTED NOT DETECTED Final    Comment: Performed at [facility], [address] [place], [place] [address]         Radiology Studies: US Abdomen Limited Ruq  Result Date: 09/16/2017 CLINICAL DATA:  Elevated LFTs. EXAM: ULTRASOUND ABDOMEN LIMITED RIGHT UPPER QUADRANT COMPARISON:  None. FINDINGS: Gallbladder: No gallstones or wall thickening visualized. No sonographic Murphy sign noted by sonographer. Common bile duct: Diameter: 5.5 mm, normal. Liver: No focal lesion identified.  Increased in parenchymal echogenicity. Portal vein is patent on color Doppler imaging with normal direction of blood flow towards the liver. IMPRESSION: 1. No acute abnormality. 2. Hepatic steatosis. Electronically Signed   By: Obie Dredge M.D.   On: 09/16/2017 10:01        Scheduled Meds: .  aspirin EC  81 mg Oral Daily  . carvedilol  6.25 mg Oral BID WC  . chlorhexidine  15 mL Mouth Rinse BID  . chlorpheniramine-HYDROcodone  5 mL Oral Q12H  . docusate sodium  100 mg Oral BID  . enoxaparin (LOVENOX) injection  40 mg Subcutaneous Q24H  . feeding supplement (ENSURE ENLIVE)  237 mL Oral BID BM  . ipratropium-albuterol  3 mL Nebulization Q6H  . mouth rinse  15 mL Mouth Rinse BID  . methylPREDNISolone (SOLU-MEDROL) injection  40 mg Intravenous Q12H  . pneumococcal 23 valent vaccine  0.5 mL Intramuscular Tomorrow-1000   Continuous Infusions: . azithromycin Stopped (09/16/17 1345)  . cefTRIAXone (ROCEPHIN)  IV Stopped (09/16/17 1422)     LOS: 6 days     Jacquelin Hawking, MD [facility] 09/17/2017, 9:21 AM Pager: [phone]  If 7PM-7AM, please contact night-coverage [email or web address] Password [facility] 09/17/2017, 9:21 AM

## 2017-09-17 NOTE — Progress Notes (Signed)
Nutrition Follow-up  DOCUMENTATION CODES:   Obesity unspecified  INTERVENTION:   -D/c Ensure Enlive po BID, each supplement provides 350 kcal and 20 grams of protein, due to poor acceptance -Premier Protein BID, each supplement 160 kcals and 30 grams protein  NUTRITION DIAGNOSIS:   Inadequate oral intake related to poor appetite, acute illness as evidenced by per patient/family report.  Ongoing  GOAL:   Patient will meet greater than or equal to 90% of their needs  Progressing  MONITOR:   PO intake, Supplement acceptance, Labs, Weight trends  REASON FOR ASSESSMENT:   Malnutrition Screening Tool    ASSESSMENT:   57 yo male admitted with acute respiratory failure with multifocal pneumonia, +flu, fatty liver. Pt with hx of asthma, CAD  2/23- transferred from ICU to SDU  Pt unavailable at time of visit.   Pt's intake is increasing; noted meal completion 25-100%. Pt is refusing Ensure supplements.   Medications reviewed and include solumedrol   Labs reviewed: Glucose: 162-179.   Diet Order:  Diet regular Room service appropriate? Yes; Fluid consistency: Thin  EDUCATION NEEDS:   No education needs have been identified at this time  Skin:  Skin Assessment: Reviewed RN Assessment  Last BM:  09/17/17  Height:   Ht Readings from Last 1 Encounters:  09/11/17 5\' 9"  (1.753 m)    Weight:   Wt Readings from Last 1 Encounters:  09/11/17 263 lb 10.7 oz (119.6 kg)    Ideal Body Weight:  72.7 kg  BMI:  Body mass index is 38.94 kg/m.  Estimated Nutritional Needs:   Kcal:  2000-2200 kcals  Protein:  119-137 g  Fluid:  >/= 2 L    Adler Alton A. Mayford Knife, RD, LDN, CDE Pager: [phone] After hours Pager: [phone]

## 2017-09-18 DIAGNOSIS — J9601 Acute respiratory failure with hypoxia: Secondary | ICD-10-CM | POA: Diagnosis not present

## 2017-09-18 LAB — CBC
HEMATOCRIT: 39.8 % (ref 39.0–52.0)
HEMOGLOBIN: 13.3 g/dL (ref 13.0–17.0)
MCH: 29.2 pg (ref 26.0–34.0)
MCHC: 33.4 g/dL (ref 30.0–36.0)
MCV: 87.3 fL (ref 78.0–100.0)
Platelets: 335 10*3/uL (ref 150–400)
RBC: 4.56 MIL/uL (ref 4.22–5.81)
RDW: 15 % (ref 11.5–15.5)
WBC: 13.7 10*3/uL — AB (ref 4.0–10.5)

## 2017-09-18 LAB — COMPREHENSIVE METABOLIC PANEL WITH GFR
ALT: 203 U/L — ABNORMAL HIGH (ref 17–63)
AST: 33 U/L (ref 15–41)
Albumin: 2.7 g/dL — ABNORMAL LOW (ref 3.5–5.0)
Alkaline Phosphatase: 80 U/L (ref 38–126)
Anion gap: 8 (ref 5–15)
BUN: 21 mg/dL — ABNORMAL HIGH (ref 6–20)
CO2: 28 mmol/L (ref 22–32)
Calcium: 8.6 mg/dL — ABNORMAL LOW (ref 8.9–10.3)
Chloride: 99 mmol/L — ABNORMAL LOW (ref 101–111)
Creatinine, Ser: 0.66 mg/dL (ref 0.61–1.24)
GFR calc Af Amer: >60 mL/min
GFR calc non Af Amer: >60 mL/min
Glucose, Bld: 173 mg/dL — ABNORMAL HIGH (ref 65–99)
Potassium: 5.1 mmol/L (ref 3.5–5.1)
Sodium: 135 mmol/L (ref 135–145)
Total Bilirubin: 0.8 mg/dL (ref 0.3–1.2)
Total Protein: 6.2 g/dL — ABNORMAL LOW (ref 6.5–8.1)

## 2017-09-18 LAB — GLUCOSE, CAPILLARY: GLUCOSE-CAPILLARY: 144 mg/dL — AB (ref 65–99)

## 2017-09-18 MED ORDER — IPRATROPIUM-ALBUTEROL 0.5-2.5 (3) MG/3ML IN SOLN
3.0000 mL | Freq: Two times a day (BID) | RESPIRATORY_TRACT | Status: DC
  Administered 2017-09-18 – 2017-09-22 (×8): 3 mL via RESPIRATORY_TRACT
  Filled 2017-09-18 (×8): qty 3

## 2017-09-18 MED ORDER — METHYLPREDNISOLONE SODIUM SUCC 40 MG IJ SOLR
40.0000 mg | Freq: Every day | INTRAMUSCULAR | Status: DC
  Administered 2017-09-19 – 2017-09-22 (×4): 40 mg via INTRAVENOUS
  Filled 2017-09-18 (×4): qty 1

## 2017-09-18 NOTE — Plan of Care (Signed)
  Education: Knowledge of General Education information will improve 09/18/2017 0213 - Progressing by Olena Mater, RN Note POC reviewed with pt.

## 2017-09-18 NOTE — Progress Notes (Signed)
PROGRESS NOTE    Jared Morse  YIR:[id number] DOB: [birth year] DOA: 09/11/2017 PCP: Lenell Antu, DO  Brief Narrative: 57 y.o. malewithhistory of CAD status post stenting in 2014who was diagnosed with the flurecently. He did not receive Tamiflu as his physician stated that he was out of the window. He was eventually started on Augmentin and Prednisone as his symptoms did not improve. He finished the Prednisone 1 day prior to admission. His symptoms still did not improve but actually worsened and he presented to the ER with respiratory distress and hypoxia.He has required BiPAP and HFNC to achieve adequate oxygen saturations. He was started on treatment for CAP with Ceftriaxone and azithromycin. Influenza positive, not started on Tamiflu    Assessment & Plan:   Principal Problem:   Acute respiratory failure with hypoxia (HCC) Active Problems:   CAD (coronary artery disease)   Multifocal pneumonia   Elevated LFTs   Elevated troponin   Elevated transaminase level  Multifocal pneumonia Influenza infection Sputum culture unremarkable. Blood cultures (2/20) no growth to date. Afebrile. Never started on Tamiflu -Continue Ceftriaxon/azithromycin -Pulm recommendations: Solu-medrol taper  Acute respiratory failure with hypoxia-patient currently on 5 L nasal cannula.. Continues BiPAP overnight -Wean O2 as able -BIPAP qhs -Patient does have CPAP at home.  CAD Cardiology consulted initially for mild troponin elevation. Recommended outpatient follow-up. -Continue aspirin  Essential hypertension Patient on Coreg -Coreg dose per below  Bradycardia Sinus bradycardia in setting of Coreg. Continues to have bradycardia at night but is asymptomatic.  -Continue Coreg to 6.25 mg BID  Elevated LFTs Possibly secondary to influenza infection. Still elevated. Hepatitis panel negative. No associated elevated alkaline phosphatase. Hepatic steatosis on ultrasound.    DVT prophylaxis  Lovenox :Code Status: Full code Family Communication:  No family available Disposition Plan: Hope to discharge him home in 48 hours.  He probably will need to go home on oxygen for a short period of time and then wean as an outpatient. Consultants P CCM    Procedures: BiPAP Antimicrobials: Rocephin and azithromycin Subjective: Denies worsening shortness of breath.  Had a good night sleep no nausea vomiting diarrhea or constipation.   Objective: Sitting up in bed in no acute distress able to talk in full sentences. Vitals:   09/18/17 0432 09/18/17 0738 09/18/17 0749 09/18/17 0750  BP: 115/71  124/71   Pulse:      Resp: 14  12 13   Temp: 98.6 F (37 C)  (!) 97.5 F (36.4 C)   TempSrc: Axillary  Oral   SpO2: 97% 99% 91% 97%  Weight:      Height:        Intake/Output Summary (Last 24 hours) at 09/18/2017 1034 Last data filed at 09/17/2017 2002 Gross per 24 hour  Intake 480 ml  Output 2375 ml  Net -1895 ml   Filed Weights   09/11/17 1122 09/11/17 2238  Weight: 119.3 kg (263 lb 0.1 oz) 119.6 kg (263 lb 10.7 oz)    Examination:  General exam: Appears calm and comfortable  Respiratory system: rhonchi left to auscultation. Respiratory effort normal. Cardiovascular system: S1 & S2 heard, RRR. No JVD, murmurs, rubs, gallops or clicks. No pedal edema. Gastrointestinal system: Abdomen is nondistended, soft and nontender. No organomegaly or masses felt. Normal bowel sounds heard. Central nervous system: Alert and oriented. No focal neurological deficits. Extremities: Symmetric 5 x 5 power. Skin: No rashes, lesions or ulcers Psychiatry: Judgement and insight appear normal. Mood & affect appropriate.     Data Reviewed:  I have personally reviewed following labs and imaging studies  CBC: Recent Labs  Lab 09/11/17 1140 09/12/17 0257 09/15/17 0208 09/16/17 0243 09/18/17 0150  WBC 8.7 7.2 12.6* 11.3* 13.7*  NEUTROABS 7.3 5.7  --   --   --   HGB 13.6 12.4* 12.3* 12.6* 13.3    HCT 38.9* 36.6* 36.9* 37.7* 39.8  MCV 83.3 85.3 86.8 86.3 87.3  PLT 216 209 262 269 335   Basic Metabolic Panel: Recent Labs  Lab 09/12/17 0257 09/12/17 1844 09/15/17 0208 09/16/17 0243 09/18/17 0150  NA 138 137 138 136 135  K 4.0 4.4 4.9 4.9 5.1  CL 107 105 102 100* 99*  CO2 22 24 28 27 28   GLUCOSE 110* 140* 162* 179* 173*  BUN 21* 15 25* 19 21*  CREATININE 0.84 0.73 0.82 0.68 0.66  CALCIUM 7.7* 7.7* 8.2* 8.3* 8.6*   GFR: Estimated Creatinine Clearance: 133.3 mL/min (by C-G formula based on SCr of 0.66 mg/dL). Liver Function Tests: Recent Labs  Lab 09/11/17 1140 09/12/17 0257 09/12/17 1844 09/16/17 0243 09/18/17 0150  AST 119* 93* 118* 45* 33  ALT 142* 127* 198* 271* 203*  ALKPHOS 75 65 79 74 80  BILITOT 1.1 1.4* 0.8 0.9 0.8  PROT 7.1 6.0* 6.1* 6.1* 6.2*  ALBUMIN 3.0* 2.4* 2.5* 2.5* 2.7*   No results for input(s): LIPASE, AMYLASE in the last 168 hours. No results for input(s): AMMONIA in the last 168 hours. Coagulation Profile: Recent Labs  Lab 09/12/17 0257  INR 1.13   Cardiac Enzymes: Recent Labs  Lab 09/11/17 1140 09/12/17 0257 09/12/17 0601 09/12/17 1241  TROPONINI 0.05* 0.03* 0.03* <0.03   BNP (last 3 results) No results for input(s): PROBNP in the last 8760 hours. HbA1C: No results for input(s): HGBA1C in the last 72 hours. CBG: Recent Labs  Lab 09/12/17 1550  GLUCAP 166*   Lipid Profile: No results for input(s): CHOL, HDL, LDLCALC, TRIG, CHOLHDL, LDLDIRECT in the last 72 hours. Thyroid Function Tests: No results for input(s): TSH, T4TOTAL, FREET4, T3FREE, THYROIDAB in the last 72 hours. Anemia Panel: No results for input(s): VITAMINB12, FOLATE, FERRITIN, TIBC, IRON, RETICCTPCT in the last 72 hours. Sepsis Labs: Recent Labs  Lab 09/11/17 1144 09/11/17 1350 09/12/17 0052 09/12/17 0257  PROCALCITON  --   --   --  <0.10  LATICACIDVEN 1.91* 1.63 1.2 0.9    Recent Results (from the past 240 hour(s))  Culture, blood (routine x 2)      Status: None   Collection Time: 09/11/17 11:30 AM  Result Value Ref Range Status   Specimen Description   Final    BLOOD RIGHT ANTECUBITAL Performed at [facility], [address] [place], [place] [address]    Special Requests   Final    BOTTLES DRAWN AEROBIC AND ANAEROBIC Blood Culture adequate volume Performed at [facility], [address], [place], [place] [address]    Culture   Final    NO GROWTH 5 DAYS Performed at [facility], [address], [place], [place] [address]    Report Status 09/16/2017 FINAL  Final  Culture, blood (routine x 2)     Status: None   Collection Time: 09/11/17 11:45 AM  Result Value Ref Range Status   Specimen Description   Final    BLOOD BLOOD RIGHT HAND Performed at [facility], [address] [place], [place] [address]    Special Requests   Final  BOTTLES DRAWN AEROBIC AND ANAEROBIC Blood Culture adequate volume Performed at [facility], [address] [place], [place] [address]    Culture   Final    NO GROWTH 5 DAYS Performed at [facility], [address], [place], [place] [address]    Report Status 09/16/2017 FINAL  Final  MRSA PCR Screening     Status: None   Collection Time: 09/11/17 10:20 PM  Result Value Ref Range Status   MRSA by PCR NEGATIVE NEGATIVE Final    Comment:        The GeneXpert MRSA Assay (FDA approved for NASAL specimens only), is one component of a comprehensive MRSA colonization surveillance program. It is not intended to diagnose MRSA infection nor to guide or monitor treatment for MRSA infections. Performed at [facility], [address] [place], [place] [address]   Culture, sputum-assessment     Status: None   Collection Time: 09/12/17 12:36 AM  Result Value Ref Range Status   Specimen Description EXPECTORATED SPUTUM  Final   Special Requests NONE  Final   Sputum evaluation   Final    THIS SPECIMEN IS ACCEPTABLE FOR SPUTUM  CULTURE Performed at [facility], [address], [place], [place] [address]    Report Status 09/12/2017 FINAL  Final  Culture, respiratory (NON-Expectorated)     Status: None   Collection Time: 09/12/17 12:36 AM  Result Value Ref Range Status   Specimen Description EXPECTORATED SPUTUM  Final   Special Requests NONE Reflexed from [id number]  Final   Gram Stain   Final    NO WBC SEEN RARE SQUAMOUS EPITHELIAL CELLS PRESENT FEW GRAM NEGATIVE RODS RARE GRAM POSITIVE COCCI IN CLUSTERS    Culture   Final    Consistent with normal respiratory flora. Performed at [facility], [address], [place], [place] [address]    Report Status 09/14/2017 FINAL  Final  Respiratory Panel by PCR     Status: Abnormal   Collection Time: 09/12/17 12:40 AM  Result Value Ref Range Status   Adenovirus NOT DETECTED NOT DETECTED Final   Coronavirus 229E NOT DETECTED NOT DETECTED Final   Coronavirus HKU1 NOT DETECTED NOT DETECTED Final   Coronavirus NL63 NOT DETECTED NOT DETECTED Final   Coronavirus OC43 NOT DETECTED NOT DETECTED Final   Metapneumovirus NOT DETECTED NOT DETECTED Final   Rhinovirus / Enterovirus NOT DETECTED NOT DETECTED Final   Influenza A DETECTED (A) NOT DETECTED Final   Influenza B NOT DETECTED NOT DETECTED Final   Parainfluenza Virus 1 NOT DETECTED NOT DETECTED Final   Parainfluenza Virus 2 NOT DETECTED NOT DETECTED Final   Parainfluenza Virus 3 NOT DETECTED NOT DETECTED Final   Parainfluenza Virus 4 NOT DETECTED NOT DETECTED Final   Respiratory Syncytial Virus NOT DETECTED NOT DETECTED Final   Bordetella pertussis NOT DETECTED NOT DETECTED Final   Chlamydophila pneumoniae NOT DETECTED NOT DETECTED Final   Mycoplasma pneumoniae NOT DETECTED NOT DETECTED Final    Comment: Performed at [facility], [address], [place], [place] [address]         Radiology Studies: No results found.      Scheduled Meds: . aspirin EC  81 mg Oral Daily  . carvedilol  6.25  mg Oral BID WC  . chlorhexidine  15 mL Mouth Rinse BID  . chlorpheniramine-HYDROcodone  5 mL Oral Q12H  . docusate sodium  100 mg Oral BID  . enoxaparin (  LOVENOX) injection  40 mg Subcutaneous Q24H  . ipratropium-albuterol  3 mL Nebulization TID  . mouth rinse  15 mL Mouth Rinse BID  . methylPREDNISolone (SOLU-MEDROL) injection  40 mg Intravenous Q12H  . pneumococcal 23 valent vaccine  0.5 mL Intramuscular Tomorrow-1000  . protein supplement shake  11 oz Oral BID BM   Continuous Infusions: . azithromycin Stopped (09/17/17 1557)  . cefTRIAXone (ROCEPHIN)  IV Stopped (09/17/17 1430)     LOS: 7 days      Alwyn Ren, MD [facility] If 7PM-7AM, please contact night-coverage [email or web address] Password [facility] 09/18/2017, 10:34 AM

## 2017-09-19 DIAGNOSIS — J9601 Acute respiratory failure with hypoxia: Secondary | ICD-10-CM | POA: Diagnosis not present

## 2017-09-19 LAB — COMPREHENSIVE METABOLIC PANEL
ALBUMIN: 2.6 g/dL — AB (ref 3.5–5.0)
ALT: 187 U/L — ABNORMAL HIGH (ref 17–63)
ANION GAP: 9 (ref 5–15)
AST: 32 U/L (ref 15–41)
Alkaline Phosphatase: 80 U/L (ref 38–126)
BUN: 23 mg/dL — ABNORMAL HIGH (ref 6–20)
CHLORIDE: 102 mmol/L (ref 101–111)
CO2: 26 mmol/L (ref 22–32)
Calcium: 8.2 mg/dL — ABNORMAL LOW (ref 8.9–10.3)
Creatinine, Ser: 0.71 mg/dL (ref 0.61–1.24)
GFR calc Af Amer: >60 mL/min (ref >60)
GFR calc non Af Amer: >60 mL/min (ref >60)
GLUCOSE: 110 mg/dL — AB (ref 65–99)
POTASSIUM: 4.3 mmol/L (ref 3.5–5.1)
Sodium: 137 mmol/L (ref 135–145)
Total Bilirubin: 0.6 mg/dL (ref 0.3–1.2)
Total Protein: 5.6 g/dL — ABNORMAL LOW (ref 6.5–8.1)

## 2017-09-19 MED ORDER — ENSURE ENLIVE PO LIQD
237.0000 mL | Freq: Two times a day (BID) | ORAL | Status: DC
  Administered 2017-09-19 – 2017-09-21 (×2): 237 mL via ORAL

## 2017-09-19 MED ORDER — ACETAMINOPHEN 325 MG PO TABS
650.0000 mg | ORAL_TABLET | Freq: Four times a day (QID) | ORAL | Status: DC | PRN
  Administered 2017-09-19: 650 mg via ORAL
  Filled 2017-09-19: qty 2

## 2017-09-19 NOTE — Evaluation (Addendum)
Physical Therapy Evaluation & Discharge Patient Details Name: Jared Morse MRN: [id number] DOB: Apr 07, 1961 Today's Date: 09/19/2017   History of Present Illness  Pt is a 57 y.o. male admitted 09/11/17 with respiratory distress and hypoxia requiring BiPAP and HFNC; started on treatment for CAP. PMH includes CAD, HTN, NSTEMI.    Clinical Impression  Patient evaluated by Physical Therapy with no further acute PT needs identified. PTA pt indep and works in [employer]; will have 24/7 support from family. Pt currently indep with mobility. SpO2 down to 86% on RA (although typically maintaining >92% while walking on RA), improving to >88% on 1L O2 [name]. Educ on energy conservation, activity tolerance, and importance of continued mobility. All education has been completed and the patient has no further questions. Encouraged to continue ambulating during hospital admission (RN notified). PT is signing off. Thank you for this referral.    Follow Up Recommendations No PT follow up    Equipment Recommendations  None recommended by PT    Recommendations for Other Services       Precautions / Restrictions Precautions Precautions: None Restrictions Weight Bearing Restrictions: No      Mobility  Bed Mobility Overal bed mobility: Independent                Transfers Overall transfer level: Independent                  Ambulation/Gait Ambulation/Gait assistance: Independent Ambulation Distance (Feet): 400 Feet Assistive device: None Gait Pattern/deviations: Step-through pattern;Decreased stride length Gait velocity: Decreased   General Gait Details: Pt indep with amb; 1x standing rest break secondary to SOB. SpO2 down to 86% on RA (although typically maintaining >92% while walking on RA), improving to >88% on 1L O2 [name]  Stairs            Wheelchair Mobility    Modified Rankin (Stroke Patients Only)       Balance Overall balance assessment: No apparent balance  deficits (not formally assessed)                                           Pertinent Vitals/Pain Pain Assessment: No/denies pain    Home Living Family/patient expects to be discharged to:: Private residence Living Arrangements: Spouse/significant other Available Help at Discharge: Family;Available 24 hours/day Type of Home: House       Home Layout: Two level;Able to live on main level with bedroom/bathroom Home Equipment: None      Prior Function Level of Independence: Independent         Comments: 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 Wears CPAP at night but no supplemental O2 during day     Hand Dominance        Extremity/Trunk Assessment   Upper Extremity Assessment Upper Extremity Assessment: Overall WFL for tasks assessed    Lower Extremity Assessment Lower Extremity Assessment: Overall WFL for tasks assessed       Communication   Communication: No difficulties  Cognition Arousal/Alertness: Awake/alert Behavior During Therapy: WFL for tasks assessed/performed Overall Cognitive Status: Within Functional Limits for tasks assessed                                        General Comments  Exercises     Assessment/Plan    PT Assessment Patent does not need any further PT services  PT Problem List         PT Treatment Interventions      PT Goals (Current goals can be found in the Care Plan section)  Acute Rehab PT Goals PT Goal Formulation: All assessment and education complete, DC therapy    Frequency     Barriers to discharge        Co-evaluation               AM-PAC PT "6 Clicks" Daily Activity  Outcome Measure Difficulty turning over in bed (including adjusting bedclothes, sheets and blankets)?: None Difficulty moving from lying on back to sitting on the side of the bed? : None Difficulty sitting down on and standing up from a chair with arms  (e.g., wheelchair, bedside commode, etc,.)?: None Help needed moving to and from a bed to chair (including a wheelchair)?: None Help needed walking in hospital room?: None Help needed climbing 3-5 steps with a railing? : None 6 Click Score: 24    End of Session Equipment Utilized During Treatment: Gait belt;Oxygen Activity Tolerance: Patient tolerated treatment well Patient left: in bed;with call bell/phone within reach Nurse Communication: Mobility status PT Visit Diagnosis: Other abnormalities of gait and mobility (R26.89)    Time: 2633-3545 PT Time Calculation (min) (ACUTE ONLY): 28 min   Charges:   PT Evaluation $PT Eval Moderate Complexity: 1 Mod PT Treatments $Gait Training: 8-22 mins   PT G Codes:       Ina Homes, PT, DPT Acute Rehab Services  Pager: [phone]  Malachy Chamber 09/19/2017, 2:09 PM

## 2017-09-19 NOTE — Progress Notes (Addendum)
PROGRESS NOTE    Jared Morse  VWU:[id number] DOB: 19-Jul-1961 DOA: 09/11/2017 PCP: Lenell Antu, DO  Brief Narrative56 y.o.malewithhistory of CAD status post stenting in 2014who was diagnosed with the flurecently. He did not receive Tamiflu as his physician stated that he was out of the window. He was eventually started on Augmentin and Prednisone as his symptoms did not improve. He finished the Prednisone 1 day prior to admission. His symptoms still did not improve but actually worsened and he presented to the ER with respiratory distress and hypoxia.He has required BiPAP and HFNC to achieve adequate oxygen saturations. He was started on treatment for CAP with Ceftriaxone and azithromycin.Influenza positive, not started on Tamiflu     Assessment & Plan:   Principal Problem:   Acute respiratory failure with hypoxia (HCC) Active Problems:   CAD (coronary artery disease)   Multifocal pneumonia   Elevated LFTs   Elevated troponin   Elevated transaminase level  Multifocal pneumonia Influenza infection Sputum culture unremarkable. Blood cultures (2/20) no growth to date. Afebrile. Never started on Tamiflu -Continue Ceftriaxon/azithromycin -Pulm recommendations: Solu-medrol taper  Acute respiratory failure with hypoxia-patient currently on 5 L nasal cannula.. Continues BiPAP overnight -Wean O2 as able -BIPAP qhs -Patient does have CPAP at home.  CAD Cardiology consulted initially for mild troponin elevation. Recommended outpatient follow-up. -Continue aspirin  Essential hypertension Patient on Coreg -Coreg dose per below  Bradycardia Sinus bradycardia in setting of Coreg.Continues to have bradycardia at night but is asymptomatic. -Continue Coreg to 6.25 mg BID  Elevated LFTs Possiblysecondary to influenza infection.  Liver function test improving.  Follow-up tomorrow.  Still elevated. Hepatitis panel negative. No associated elevated alkaline phosphatase.Hepatic  steatosis on ultrasound.         DVT prophylaxis Lovenox Code Status: Full code Family Communication: No family available Disposition Plan: Plan discharge tomorrow  Consultants: P CCM  Procedures: None Antimicrobials: Azithromycin Rocephin  Subjective: Resting with BiPAP on in no acute distress.  Objective: Vitals:   09/19/17 0257 09/19/17 0353 09/19/17 0743 09/19/17 0928  BP:  110/67  (!) 105/58  Pulse: (!) 53  (!) 56 72  Resp: 17 12 17    Temp:  98.6 F (37 C)    TempSrc:  Oral    SpO2: 100% 96% 100%   Weight:      Height:        Intake/Output Summary (Last 24 hours) at 09/19/2017 1044 Last data filed at 09/18/2017 2131 Gross per 24 hour  Intake 120 ml  Output 1300 ml  Net -1180 ml   Filed Weights   09/11/17 1122 09/11/17 2238  Weight: 119.3 kg (263 lb 0.1 oz) 119.6 kg (263 lb 10.7 oz)    Examination:  General exam: Appears calm and comfortable  Respiratory system: Clear to auscultation. Respiratory effort normal. Cardiovascular system: S1 & S2 heard, RRR. No JVD, murmurs, rubs, gallops or clicks. No pedal edema. Gastrointestinal system: Abdomen is nondistended, soft and nontender. No organomegaly or masses felt. Normal bowel sounds heard. Central nervous system: Alert and oriented. No focal neurological deficits. Extremities: Symmetric 5 x 5 power. Skin: No rashes, lesions or ulcers Psychiatry: Judgement and insight appear normal. Mood & affect appropriate.     Data Reviewed: I have personally reviewed following labs and imaging studies  CBC: Recent Labs  Lab 09/15/17 0208 09/16/17 0243 09/18/17 0150  WBC 12.6* 11.3* 13.7*  HGB 12.3* 12.6* 13.3  HCT 36.9* 37.7* 39.8  MCV 86.8 86.3 87.3  PLT 262 269 335  Basic Metabolic Panel: Recent Labs  Lab 09/12/17 1844 09/15/17 0208 09/16/17 0243 09/18/17 0150 09/19/17 0305  NA 137 138 136 135 137  K 4.4 4.9 4.9 5.1 4.3  CL 105 102 100* 99* 102  CO2 24 28 27 28 26   GLUCOSE 140* 162* 179*  173* 110*  BUN 15 25* 19 21* 23*  CREATININE 0.73 0.82 0.68 0.66 0.71  CALCIUM 7.7* 8.2* 8.3* 8.6* 8.2*   GFR: Estimated Creatinine Clearance: 133.3 mL/min (by C-G formula based on SCr of 0.71 mg/dL). Liver Function Tests: Recent Labs  Lab 09/12/17 1844 09/16/17 0243 09/18/17 0150 09/19/17 0305  AST 118* 45* 33 32  ALT 198* 271* 203* 187*  ALKPHOS 79 74 80 80  BILITOT 0.8 0.9 0.8 0.6  PROT 6.1* 6.1* 6.2* 5.6*  ALBUMIN 2.5* 2.5* 2.7* 2.6*   No results for input(s): LIPASE, AMYLASE in the last 168 hours. No results for input(s): AMMONIA in the last 168 hours. Coagulation Profile: No results for input(s): INR, PROTIME in the last 168 hours. Cardiac Enzymes: Recent Labs  Lab 09/12/17 1241  TROPONINI <0.03   BNP (last 3 results) No results for input(s): PROBNP in the last 8760 hours. HbA1C: No results for input(s): HGBA1C in the last 72 hours. CBG: Recent Labs  Lab 09/12/17 1550 09/18/17 1707  GLUCAP 166* 144*   Lipid Profile: No results for input(s): CHOL, HDL, LDLCALC, TRIG, CHOLHDL, LDLDIRECT in the last 72 hours. Thyroid Function Tests: No results for input(s): TSH, T4TOTAL, FREET4, T3FREE, THYROIDAB in the last 72 hours. Anemia Panel: No results for input(s): VITAMINB12, FOLATE, FERRITIN, TIBC, IRON, RETICCTPCT in the last 72 hours. Sepsis Labs: No results for input(s): PROCALCITON, LATICACIDVEN in the last 168 hours.  Recent Results (from the past 240 hour(s))  Culture, blood (routine x 2)     Status: None   Collection Time: 09/11/17 11:30 AM  Result Value Ref Range Status   Specimen Description   Final    BLOOD RIGHT ANTECUBITAL Performed at [facility], [address] [place], [place] [address]    Special Requests   Final    BOTTLES DRAWN AEROBIC AND ANAEROBIC Blood Culture adequate volume Performed at [facility], [address] [place], [place] [address]    Culture   Final    NO GROWTH 5 DAYS Performed at [facility], [address] [place], [place] [address]    Report Status 09/16/2017 FINAL  Final  Culture, blood (routine x 2)     Status: None   Collection Time: 09/11/17 11:45 AM  Result Value Ref Range Status   Specimen Description   Final    BLOOD BLOOD RIGHT HAND Performed at [facility], [address] [place], [place] [address]    Special Requests   Final    BOTTLES DRAWN AEROBIC AND ANAEROBIC Blood Culture adequate volume Performed at [facility], [address] , [place] [address]    Culture   Final    NO GROWTH 5 DAYS Performed at [facility], [address], [place], [place] [address]    Report Status 09/16/2017 FINAL  Final  MRSA PCR Screening     Status: None   Collection Time: 09/11/17 10:20 PM  Result Value Ref Range Status   MRSA by PCR NEGATIVE NEGATIVE Final    Comment:        The GeneXpert MRSA Assay (FDA approved for NASAL specimens  only), is one component of a comprehensive MRSA colonization surveillance program. It is not intended to diagnose MRSA infection nor to guide or monitor treatment for MRSA infections. Performed at [facility], [address], [place], [place] [address]   Culture, sputum-assessment     Status: None   Collection Time: 09/12/17 12:36 AM  Result Value Ref Range Status   Specimen Description EXPECTORATED SPUTUM  Final   Special Requests NONE  Final   Sputum evaluation   Final    THIS SPECIMEN IS ACCEPTABLE FOR SPUTUM CULTURE Performed at [facility], [address] [place], [place] [address]    Report Status 09/12/2017 FINAL  Final  Culture, respiratory (NON-Expectorated)     Status: None   Collection Time: 09/12/17 12:36 AM  Result Value Ref Range Status   Specimen Description EXPECTORATED SPUTUM  Final   Special Requests NONE Reflexed from [id number]  Final   Gram Stain   Final    NO WBC SEEN RARE SQUAMOUS EPITHELIAL CELLS PRESENT FEW GRAM NEGATIVE RODS RARE GRAM POSITIVE  COCCI IN CLUSTERS    Culture   Final    Consistent with normal respiratory flora. Performed at [facility], [address] [place], [place] [address]    Report Status 09/14/2017 FINAL  Final  Respiratory Panel by PCR     Status: Abnormal   Collection Time: 09/12/17 12:40 AM  Result Value Ref Range Status   Adenovirus NOT DETECTED NOT DETECTED Final   Coronavirus 229E NOT DETECTED NOT DETECTED Final   Coronavirus HKU1 NOT DETECTED NOT DETECTED Final   Coronavirus NL63 NOT DETECTED NOT DETECTED Final   Coronavirus OC43 NOT DETECTED NOT DETECTED Final   Metapneumovirus NOT DETECTED NOT DETECTED Final   Rhinovirus / Enterovirus NOT DETECTED NOT DETECTED Final   Influenza A DETECTED (A) NOT DETECTED Final   Influenza B NOT DETECTED NOT DETECTED Final   Parainfluenza Virus 1 NOT DETECTED NOT DETECTED Final   Parainfluenza Virus 2 NOT DETECTED NOT DETECTED Final   Parainfluenza Virus 3 NOT DETECTED NOT DETECTED Final   Parainfluenza Virus 4 NOT DETECTED NOT DETECTED Final   Respiratory Syncytial Virus NOT DETECTED NOT DETECTED Final   Bordetella pertussis NOT DETECTED NOT DETECTED Final   Chlamydophila pneumoniae NOT DETECTED NOT DETECTED Final   Mycoplasma pneumoniae NOT DETECTED NOT DETECTED Final    Comment: Performed at [facility], [address], [place], [place] [address]         Radiology Studies: No results found.      Scheduled Meds: . aspirin EC  81 mg Oral Daily  . carvedilol  6.25 mg Oral BID WC  . chlorhexidine  15 mL Mouth Rinse BID  . chlorpheniramine-HYDROcodone  5 mL Oral Q12H  . docusate sodium  100 mg Oral BID  . enoxaparin (LOVENOX) injection  40 mg Subcutaneous Q24H  . feeding supplement (ENSURE ENLIVE)  237 mL Oral BID BM  . ipratropium-albuterol  3 mL Nebulization BID  . mouth rinse  15 mL Mouth Rinse BID  . methylPREDNISolone (SOLU-MEDROL) injection  40 mg Intravenous Daily  . pneumococcal 23 valent vaccine  0.5 mL Intramuscular  Tomorrow-1000  . protein supplement shake  11 oz Oral BID BM   Continuous Infusions: . azithromycin Stopped (09/18/17 1530)  . cefTRIAXone (ROCEPHIN)  IV Stopped (09/18/17 1430)     LOS: 8 days     Alwyn Ren, MD [facility]  If 7PM-7AM, please contact night-coverage  [email or web address] Password [facility] 09/19/2017, 10:44 AM

## 2017-09-19 NOTE — Progress Notes (Signed)
Dr. Jerolyn Center wanted to check SpO2 with ambulation. PT was walking with patient and Pt needs O2 while he is ambulation, but it was not accurate. Pt's wife was concerning his only wear 1L of O2 then desaturate down to middle 80's when activities. Wife talked to Dr. Jerolyn Center regarding this matter and Dr. Jerolyn Center ordered keep O2 2L via [place] today without titration. Explained pt & wife keeping O2 2L today. They understood it. Patient was resting in the bed and SpO2 94-96% with O2 2L. Night nurse made aware of this matter. HS [employer]

## 2017-09-20 ENCOUNTER — Inpatient Hospital Stay (HOSPITAL_COMMUNITY): Payer: Federal, State, Local not specified - PPO

## 2017-09-20 DIAGNOSIS — J9601 Acute respiratory failure with hypoxia: Secondary | ICD-10-CM | POA: Diagnosis not present

## 2017-09-20 DIAGNOSIS — J9811 Atelectasis: Secondary | ICD-10-CM | POA: Diagnosis not present

## 2017-09-20 LAB — CBC WITH DIFFERENTIAL/PLATELET
BASOS ABS: 0 10*3/uL (ref 0.0–0.1)
BASOS PCT: 0 %
EOS ABS: 0 10*3/uL (ref 0.0–0.7)
Eosinophils Relative: 0 %
HCT: 37.6 % — ABNORMAL LOW (ref 39.0–52.0)
HEMOGLOBIN: 12.5 g/dL — AB (ref 13.0–17.0)
Lymphocytes Relative: 13 %
Lymphs Abs: 1.5 10*3/uL (ref 0.7–4.0)
MCH: 28.7 pg (ref 26.0–34.0)
MCHC: 33.2 g/dL (ref 30.0–36.0)
MCV: 86.4 fL (ref 78.0–100.0)
MONO ABS: 0.8 10*3/uL (ref 0.1–1.0)
MONOS PCT: 7 %
NEUTROS ABS: 9.2 10*3/uL — AB (ref 1.7–7.7)
NEUTROS PCT: 80 %
Platelets: 303 10*3/uL (ref 150–400)
RBC: 4.35 MIL/uL (ref 4.22–5.81)
RDW: 14.6 % (ref 11.5–15.5)
WBC: 11.5 10*3/uL — ABNORMAL HIGH (ref 4.0–10.5)

## 2017-09-20 LAB — COMPREHENSIVE METABOLIC PANEL
ALBUMIN: 2.6 g/dL — AB (ref 3.5–5.0)
ALT: 171 U/L — ABNORMAL HIGH (ref 17–63)
ANION GAP: 10 (ref 5–15)
AST: 30 U/L (ref 15–41)
Alkaline Phosphatase: 75 U/L (ref 38–126)
BUN: 20 mg/dL (ref 6–20)
CHLORIDE: 101 mmol/L (ref 101–111)
CO2: 26 mmol/L (ref 22–32)
Calcium: 8.4 mg/dL — ABNORMAL LOW (ref 8.9–10.3)
Creatinine, Ser: 0.66 mg/dL (ref 0.61–1.24)
GFR calc Af Amer: >60 mL/min (ref >60)
GFR calc non Af Amer: >60 mL/min (ref >60)
GLUCOSE: 107 mg/dL — AB (ref 65–99)
POTASSIUM: 4.6 mmol/L (ref 3.5–5.1)
SODIUM: 137 mmol/L (ref 135–145)
Total Bilirubin: 0.8 mg/dL (ref 0.3–1.2)
Total Protein: 5.9 g/dL — ABNORMAL LOW (ref 6.5–8.1)

## 2017-09-20 NOTE — Progress Notes (Signed)
PROGRESS NOTE    Jared Morse  ZOX:[id number] DOB: 08/23/1960 DOA: 09/11/2017 PCP: Lenell Antu, DO  Brief Narrative56 y.o.malewithhistory of CAD status post stenting in 2014who was diagnosed with the flurecently. He did not receive Tamiflu as his physician stated that he was out of the window. He was eventually started on Augmentin and Prednisone as his symptoms did not improve. He finished the Prednisone 1 day prior to admission. His symptoms still did not improve but actually worsened and he presented to the ER with respiratory distress and hypoxia.He has required BiPAP and HFNC to achieve adequate oxygen saturations. He was started on treatment for CAP with Ceftriaxone and azithromycin.Influenza positive, not started on Tamiflu     Assessment & Plan:   Principal Problem:   Acute respiratory failure with hypoxia (HCC) Active Problems:   CAD (coronary artery disease)   Multifocal pneumonia   Elevated LFTs   Elevated troponin   Elevated transaminase level  Multifocal pneumonia Influenza infection-repeat chest x-ray today shows patchy bilateral pulmonary infiltrates with slight interim clearing. Sputum culture unremarkable. Blood cultures (2/20) no growth to date. Afebrile. Never started on Tamiflu -Continue Ceftriaxon/azithromycin -Pulm recommendations: Solu-medrol taper  Acute respiratory failure with hypoxia-patient currently on 5 L nasal cannula.. Continues BiPAP overnight -Wean O2 as able -BIPAP qhs -Patient does have CPAP at home.  CAD Cardiology consulted initially for mild troponin elevation. Recommended outpatient follow-up. -Continue aspirin  Essential hypertension Patient on Coreg -Coreg dose per below  Bradycardia Sinus bradycardia in setting of Coreg.Continues to have bradycardia at night but is asymptomatic. -Continue Coreg to 6.25 mg BID   Elevated LFTs Possiblysecondary to influenza infection.  Liver function test improving.  Follow-up  tomorrow.  Still elevated. Hepatitis panel negative. No associated elevated alkaline phosphatase.Hepatic steatosis on ultrasound.      DVT prophylaxis: Lovenox  code Status: Full code Family Communication: No family available Disposition Plan: Plan discharge tomorrow on 2 L of oxygen.  Consultants:  P CCM Procedures: None Antimicrobials:  Azithromycin Rocephin Subjective: Resting in bed with BiPAP on.   Objective: Vitals:   09/20/17 0735 09/20/17 0810 09/20/17 0812 09/20/17 1121  BP:  119/71 119/71 106/64  Pulse:  68 63 66  Resp:   16 15  Temp:   98.2 F (36.8 C) 98.8 F (37.1 C)  TempSrc:   Oral Oral  SpO2: 99%  92% 93%  Weight:      Height:        Intake/Output Summary (Last 24 hours) at 09/20/2017 1157 Last data filed at 09/20/2017 0900 Gross per 24 hour  Intake 1850 ml  Output 1730 ml  Net 120 ml   Filed Weights   09/11/17 1122 09/11/17 2238  Weight: 119.3 kg (263 lb 0.1 oz) 119.6 kg (263 lb 10.7 oz)    Examination:  General exam: Appears calm and comfortable  Respiratory system: Clear to auscultation. Respiratory effort normal. Cardiovascular system: S1 & S2 heard, RRR. No JVD, murmurs, rubs, gallops or clicks. No pedal edema. Gastrointestinal system: Abdomen is nondistended, soft and nontender. No organomegaly or masses felt. Normal bowel sounds heard. Central nervous system: Alert and oriented. No focal neurological deficits. Extremities: Symmetric 5 x 5 power. Skin: No rashes, lesions or ulcers Psychiatry: Judgement and insight appear normal. Mood & affect appropriate.     Data Reviewed: I have personally reviewed following labs and imaging studies  CBC: Recent Labs  Lab 09/15/17 0208 09/16/17 0243 09/18/17 0150 09/20/17 0253  WBC 12.6* 11.3* 13.7* 11.5*  NEUTROABS  --   --   --  9.2*  HGB 12.3* 12.6* 13.3 12.5*  HCT 36.9* 37.7* 39.8 37.6*  MCV 86.8 86.3 87.3 86.4  PLT 262 269 335 303   Basic Metabolic Panel: Recent Labs  Lab  09/15/17 0208 09/16/17 0243 09/18/17 0150 09/19/17 0305 09/20/17 0253  NA 138 136 135 137 137  K 4.9 4.9 5.1 4.3 4.6  CL 102 100* 99* 102 101  CO2 28 27 28 26 26   GLUCOSE 162* 179* 173* 110* 107*  BUN 25* 19 21* 23* 20  CREATININE 0.82 0.68 0.66 0.71 0.66  CALCIUM 8.2* 8.3* 8.6* 8.2* 8.4*   GFR: Estimated Creatinine Clearance: 133.3 mL/min (by C-G formula based on SCr of 0.66 mg/dL). Liver Function Tests: Recent Labs  Lab 09/16/17 0243 09/18/17 0150 09/19/17 0305 09/20/17 0253  AST 45* 33 32 30  ALT 271* 203* 187* 171*  ALKPHOS 74 80 80 75  BILITOT 0.9 0.8 0.6 0.8  PROT 6.1* 6.2* 5.6* 5.9*  ALBUMIN 2.5* 2.7* 2.6* 2.6*   No results for input(s): LIPASE, AMYLASE in the last 168 hours. No results for input(s): AMMONIA in the last 168 hours. Coagulation Profile: No results for input(s): INR, PROTIME in the last 168 hours. Cardiac Enzymes: No results for input(s): CKTOTAL, CKMB, CKMBINDEX, TROPONINI in the last 168 hours. BNP (last 3 results) No results for input(s): PROBNP in the last 8760 hours. HbA1C: No results for input(s): HGBA1C in the last 72 hours. CBG: Recent Labs  Lab 09/18/17 1707  GLUCAP 144*   Lipid Profile: No results for input(s): CHOL, HDL, LDLCALC, TRIG, CHOLHDL, LDLDIRECT in the last 72 hours. Thyroid Function Tests: No results for input(s): TSH, T4TOTAL, FREET4, T3FREE, THYROIDAB in the last 72 hours. Anemia Panel: No results for input(s): VITAMINB12, FOLATE, FERRITIN, TIBC, IRON, RETICCTPCT in the last 72 hours. Sepsis Labs: No results for input(s): PROCALCITON, LATICACIDVEN in the last 168 hours.  Recent Results (from the past 240 hour(s))  Culture, blood (routine x 2)     Status: None   Collection Time: 09/11/17 11:30 AM  Result Value Ref Range Status   Specimen Description   Final    BLOOD RIGHT ANTECUBITAL Performed at [facility], [address] [place], [place] [address]    Special Requests   Final    BOTTLES DRAWN  AEROBIC AND ANAEROBIC Blood Culture adequate volume Performed at [facility], [address] [place], [place] [address]    Culture   Final    NO GROWTH 5 DAYS Performed at [facility], [address], [place], [place] [address]    Report Status 09/16/2017 FINAL  Final  Culture, blood (routine x 2)     Status: None   Collection Time: 09/11/17 11:45 AM  Result Value Ref Range Status   Specimen Description   Final    BLOOD BLOOD RIGHT HAND Performed at [facility], [address] [place], [place] [address]    Special Requests   Final    BOTTLES DRAWN AEROBIC AND ANAEROBIC Blood Culture adequate volume Performed at [facility], [address] [place], [place] [address]    Culture   Final    NO GROWTH 5 DAYS Performed at [facility], [address] [place], [place] [address]    Report Status 09/16/2017 FINAL  Final  MRSA PCR Screening     Status: None   Collection Time: 09/11/17 10:20 PM  Result Value Ref Range Status  MRSA by PCR NEGATIVE NEGATIVE Final    Comment:        The GeneXpert MRSA Assay (FDA approved for NASAL specimens only), is one component of a comprehensive MRSA colonization surveillance program. It is not intended to diagnose MRSA infection nor to guide or monitor treatment for MRSA infections. Performed at [facility], [address] [place], [place] [address]   Culture, sputum-assessment     Status: None   Collection Time: 09/12/17 12:36 AM  Result Value Ref Range Status   Specimen Description EXPECTORATED SPUTUM  Final   Special Requests NONE  Final   Sputum evaluation   Final    THIS SPECIMEN IS ACCEPTABLE FOR SPUTUM CULTURE Performed at [facility], [address] [place], [place] [address]    Report Status 09/12/2017 FINAL  Final  Culture, respiratory (NON-Expectorated)     Status: None   Collection Time: 09/12/17 12:36 AM  Result Value Ref Range Status   Specimen Description  EXPECTORATED SPUTUM  Final   Special Requests NONE Reflexed from [id number]  Final   Gram Stain   Final    NO WBC SEEN RARE SQUAMOUS EPITHELIAL CELLS PRESENT FEW GRAM NEGATIVE RODS RARE GRAM POSITIVE COCCI IN CLUSTERS    Culture   Final    Consistent with normal respiratory flora. Performed at [facility], [address] [place], [place], [place] [address]    Report Status 09/14/2017 FINAL  Final  Respiratory Panel by PCR     Status: Abnormal   Collection Time: 09/12/17 12:40 AM  Result Value Ref Range Status   Adenovirus NOT DETECTED NOT DETECTED Final   Coronavirus 229E NOT DETECTED NOT DETECTED Final   Coronavirus HKU1 NOT DETECTED NOT DETECTED Final   Coronavirus NL63 NOT DETECTED NOT DETECTED Final   Coronavirus OC43 NOT DETECTED NOT DETECTED Final   Metapneumovirus NOT DETECTED NOT DETECTED Final   Rhinovirus / Enterovirus NOT DETECTED NOT DETECTED Final   Influenza A DETECTED (A) NOT DETECTED Final   Influenza B NOT DETECTED NOT DETECTED Final   Parainfluenza Virus 1 NOT DETECTED NOT DETECTED Final   Parainfluenza Virus 2 NOT DETECTED NOT DETECTED Final   Parainfluenza Virus 3 NOT DETECTED NOT DETECTED Final   Parainfluenza Virus 4 NOT DETECTED NOT DETECTED Final   Respiratory Syncytial Virus NOT DETECTED NOT DETECTED Final   Bordetella pertussis NOT DETECTED NOT DETECTED Final   Chlamydophila pneumoniae NOT DETECTED NOT DETECTED Final   Mycoplasma pneumoniae NOT DETECTED NOT DETECTED Final    Comment: Performed at [facility], [address], [place], [place] [address]         Radiology Studies: Dg Chest Port 1 View  Result Date: 09/20/2017 CLINICAL DATA:  09/15/2017. EXAM: PORTABLE CHEST 1 VIEW COMPARISON:  09/15/2017. FINDINGS: Mediastinum and hilar structures stable. Heart size stable. Patchy bilateral pulmonary infiltrates are again noted. Slight interim clearing. Low lung volumes with basilar atelectasis. No pleural effusion or pneumothorax. IMPRESSION: Patchy  bilateral pulmonary infiltrates are again noted. Slight interim clearing. Low lung volumes with basilar atelectasis. Electronically Signed   By: Maisie Fus  Register   On: 09/20/2017 07:14        Scheduled Meds: . aspirin EC  81 mg Oral Daily  . carvedilol  6.25 mg Oral BID WC  . chlorhexidine  15 mL Mouth Rinse BID  . chlorpheniramine-HYDROcodone  5 mL Oral Q12H  . docusate sodium  100 mg Oral BID  . enoxaparin (LOVENOX) injection  40  mg Subcutaneous Q24H  . feeding supplement (ENSURE ENLIVE)  237 mL Oral BID BM  . ipratropium-albuterol  3 mL Nebulization BID  . mouth rinse  15 mL Mouth Rinse BID  . methylPREDNISolone (SOLU-MEDROL) injection  40 mg Intravenous Daily  . pneumococcal 23 valent vaccine  0.5 mL Intramuscular Tomorrow-1000  . protein supplement shake  11 oz Oral BID BM   Continuous Infusions: . azithromycin Stopped (09/19/17 1500)  . cefTRIAXone (ROCEPHIN)  IV Stopped (09/19/17 1259)     LOS: 9 days       Alwyn Ren, MD [facility] If 7PM-7AM, please contact night-coverage [email or web address] Password [facility] 09/20/2017, 11:57 AM

## 2017-09-20 NOTE — Progress Notes (Signed)
SATURATION QUALIFICATIONS: (This note is used to comply with regulatory documentation for home oxygen)  Patient Saturations on Room Air at Rest = 92%  Patient Saturations on Room Air while Ambulating = 86%  Patient Saturations on 2 Liters of oxygen while Ambulating = 91%  Please briefly explain why patient needs home oxygen:  Patient oxygen saturations drop to the mid 80s while ambulating on room air.

## 2017-09-20 NOTE — Care Management Note (Addendum)
Case Management Note  Patient Details  Name: Jared Morse MRN: [id number] Date of Birth: [birth year]  Subjective/Objective:     Pt admitted with flu               Action/Plan:  PTA independent from home with wife.  Pt has PCP and denied barriers with obtaining/paying for medications.     Expected Discharge Date:                  Expected Discharge Plan:  Home/Self Care  In-House Referral:     Discharge planning Services  CM Consult  Post Acute Care Choice:    Choice offered to:  Patient  DME Arranged:  Oxygen DME Agency:  [facility]  HH Arranged:    HH Agency:     Status of Service:  In process, will continue to follow  If discussed at Long Length of Stay Meetings, dates discussed:    Additional Comments: CM verified that pt will not discharge home needing BIPAP.  Pt offered home oxygen choice agency - pt chose [facility] - agency contacted and referral accepted.  CM contacted by [facility] CM - discharge plan communicated Cherylann Parr, RN 09/20/2017, 3:17 PM

## 2017-09-21 DIAGNOSIS — K7200 Acute and subacute hepatic failure without coma: Secondary | ICD-10-CM | POA: Diagnosis not present

## 2017-09-21 DIAGNOSIS — I472 Ventricular tachycardia, unspecified: Secondary | ICD-10-CM

## 2017-09-21 DIAGNOSIS — J1008 Influenza due to other identified influenza virus with other specified pneumonia: Secondary | ICD-10-CM | POA: Diagnosis not present

## 2017-09-21 DIAGNOSIS — J9601 Acute respiratory failure with hypoxia: Secondary | ICD-10-CM | POA: Diagnosis not present

## 2017-09-21 LAB — CBC
HCT: 39.5 % (ref 39.0–52.0)
HEMOGLOBIN: 13.1 g/dL (ref 13.0–17.0)
MCH: 28.9 pg (ref 26.0–34.0)
MCHC: 33.2 g/dL (ref 30.0–36.0)
MCV: 87.2 fL (ref 78.0–100.0)
Platelets: 306 10*3/uL (ref 150–400)
RBC: 4.53 MIL/uL (ref 4.22–5.81)
RDW: 15.2 % (ref 11.5–15.5)
WBC: 8.9 10*3/uL (ref 4.0–10.5)

## 2017-09-21 LAB — MAGNESIUM: Magnesium: 2.3 mg/dL (ref 1.7–2.4)

## 2017-09-21 LAB — TROPONIN I: Troponin I: <0.03 ng/mL (ref <0.03)

## 2017-09-21 LAB — POTASSIUM: Potassium: 4.4 mmol/L (ref 3.5–5.1)

## 2017-09-21 LAB — COMPREHENSIVE METABOLIC PANEL
ALBUMIN: 2.8 g/dL — AB (ref 3.5–5.0)
ALK PHOS: 73 U/L (ref 38–126)
ALT: 124 U/L — ABNORMAL HIGH (ref 17–63)
ANION GAP: 11 (ref 5–15)
AST: 40 U/L (ref 15–41)
BUN: 20 mg/dL (ref 6–20)
CALCIUM: 8.3 mg/dL — AB (ref 8.9–10.3)
CO2: 24 mmol/L (ref 22–32)
Chloride: 100 mmol/L — ABNORMAL LOW (ref 101–111)
Creatinine, Ser: 0.69 mg/dL (ref 0.61–1.24)
GFR calc Af Amer: >60 mL/min (ref >60)
GFR calc non Af Amer: >60 mL/min (ref >60)
GLUCOSE: 83 mg/dL (ref 65–99)
Potassium: 5.8 mmol/L — ABNORMAL HIGH (ref 3.5–5.1)
Sodium: 135 mmol/L (ref 135–145)
Total Bilirubin: 2.4 mg/dL — ABNORMAL HIGH (ref 0.3–1.2)
Total Protein: 6.2 g/dL — ABNORMAL LOW (ref 6.5–8.1)

## 2017-09-21 MED ORDER — SODIUM POLYSTYRENE SULFONATE 15 GM/60ML PO SUSP
30.0000 g | Freq: Once | ORAL | Status: AC
  Administered 2017-09-21: 30 g via ORAL
  Filled 2017-09-21 (×2): qty 120

## 2017-09-21 MED ORDER — CARVEDILOL 12.5 MG PO TABS
12.5000 mg | ORAL_TABLET | Freq: Two times a day (BID) | ORAL | Status: DC
  Administered 2017-09-21 – 2017-09-22 (×2): 12.5 mg via ORAL
  Filled 2017-09-21 (×2): qty 1

## 2017-09-21 NOTE — Progress Notes (Signed)
Progress Note  Patient Name: Jared Morse Date of Encounter: 09/21/2017  Primary Cardiologist: Eden Emms   Subjective   Currently improved after a long hospital course with antibiotics for respiratory failure.  This morning had a run of ventricular tachycardia that lasted 28 beats.  He was asymptomatic at the time.  He does have a history of coronary artery disease and has had an MI in the past.  Inpatient Medications    Scheduled Meds: . aspirin EC  81 mg Oral Daily  . carvedilol  6.25 mg Oral BID WC  . chlorhexidine  15 mL Mouth Rinse BID  . chlorpheniramine-HYDROcodone  5 mL Oral Q12H  . docusate sodium  100 mg Oral BID  . enoxaparin (LOVENOX) injection  40 mg Subcutaneous Q24H  . feeding supplement (ENSURE ENLIVE)  237 mL Oral BID BM  . ipratropium-albuterol  3 mL Nebulization BID  . mouth rinse  15 mL Mouth Rinse BID  . methylPREDNISolone (SOLU-MEDROL) injection  40 mg Intravenous Daily  . pneumococcal 23 valent vaccine  0.5 mL Intramuscular Tomorrow-1000  . protein supplement shake  11 oz Oral BID BM   Continuous Infusions:  PRN Meds: acetaminophen, albuterol, guaiFENesin-dextromethorphan, ondansetron **OR** ondansetron (ZOFRAN) IV   Vital Signs    Vitals:   09/21/17 0358 09/21/17 0748 09/21/17 0845 09/21/17 0848  BP:   129/73   Pulse:  (!) 52  62  Resp: 15 16    Temp:   98.3 F (36.8 C)   TempSrc:   Oral   SpO2:  98%    Weight:      Height:        Intake/Output Summary (Last 24 hours) at 09/21/2017 1141 Last data filed at 09/20/2017 1420 Gross per 24 hour  Intake 360 ml  Output 675 ml  Net -315 ml   Filed Weights   09/11/17 1122 09/11/17 2238  Weight: 263 lb 0.1 oz (119.3 kg) 263 lb 10.7 oz (119.6 kg)    Telemetry    Sinus rhythm with wide-complex tachycardia- Personally Reviewed  ECG    Sinus rhythm, possible inferior posterior MI- Personally Reviewed  Physical Exam   GEN: No acute distress.   Neck: No JVD Cardiac: RRR, no murmurs, rubs, or  gallops.  Respiratory: Clear to auscultation bilaterally. GI: Soft, nontender, non-distended  MS: No edema; No deformity. Neuro:  Nonfocal  Psych: Normal affect   Labs    Chemistry Recent Labs  Lab 09/19/17 0305 09/20/17 0253 09/21/17 0836  NA 137 137 135  K 4.3 4.6 5.8*  CL 102 101 100*  CO2 26 26 24   GLUCOSE 110* 107* 83  BUN 23* 20 20  CREATININE 0.71 0.66 0.69  CALCIUM 8.2* 8.4* 8.3*  PROT 5.6* 5.9* 6.2*  ALBUMIN 2.6* 2.6* 2.8*  AST 32 30 40  ALT 187* 171* 124*  ALKPHOS 80 75 73  BILITOT 0.6 0.8 2.4*  GFRNONAA >60 >60 >60  GFRAA >60 >60 >60  ANIONGAP 9 10 11      Hematology Recent Labs  Lab 09/18/17 0150 09/20/17 0253 09/21/17 0836  WBC 13.7* 11.5* 8.9  RBC 4.56 4.35 4.53  HGB 13.3 12.5* 13.1  HCT 39.8 37.6* 39.5  MCV 87.3 86.4 87.2  MCH 29.2 28.7 28.9  MCHC 33.4 33.2 33.2  RDW 15.0 14.6 15.2  PLT 335 303 306    Cardiac Enzymes Recent Labs  Lab 09/21/17 0836  TROPONINI <0.03   No results for input(s): TROPIPOC in the last 168 hours.   BNPNo results  for input(s): BNP, PROBNP in the last 168 hours.   DDimer No results for input(s): DDIMER in the last 168 hours.   Radiology    Dg Chest Port 1 View  Result Date: 09/20/2017 CLINICAL DATA:  09/15/2017. EXAM: PORTABLE CHEST 1 VIEW COMPARISON:  09/15/2017. FINDINGS: Mediastinum and hilar structures stable. Heart size stable. Patchy bilateral pulmonary infiltrates are again noted. Slight interim clearing. Low lung volumes with basilar atelectasis. No pleural effusion or pneumothorax. IMPRESSION: Patchy bilateral pulmonary infiltrates are again noted. Slight interim clearing. Low lung volumes with basilar atelectasis. Electronically Signed   By: Maisie Fus  Register   On: 09/20/2017 07:14    Cardiac Studies   Echo pending  Patient Profile     57 y.o. male with a history of end STEMI and PCI to the RCA in 2015 with an EF of 50-55% previously who presented to the hospital with respiratory failure and also  had wide-complex tachycardia this morning.  Assessment & Plan    1.  Wide-complex tachycardia: Appears to be due to ventricular tachycardia.  Patient has had an MI in the past as well as an echo in 2015 with an EF of 50-60%.  Due to his wide complex tachycardia, we Ossie Yebra repeat echo today.  He is on carvedilol at home, but the dose was decreased this admission.  Alexsia Klindt increase his dose to his prior home dose.  Should his echo show a normal ejection fraction, would plan to follow-up in cardiology clinic as previously planned.  2.  Coronary artery disease: Status post RCA drug-eluting stent in 2015.  No current chest pain.  For questions or updates, please contact [facility] Please consult [email or web address] for contact info under Cardiology/STEMI.      Signed, Jarmel Linhardt Jorja Loa, MD  09/21/2017, 11:41 AM

## 2017-09-21 NOTE — Progress Notes (Signed)
PROGRESS NOTE    Jared Morse  WUJ:[id number] DOB: [birth year] DOA: 09/11/2017 PCP: Lenell Antu, DO  Brief Narrative:Narrative56 y.o.malewithhistory of CAD status post stenting in 2014who was diagnosed with the flurecently. He did not receive Tamiflu as his physician stated that he was out of the window. He was eventually started on Augmentin and Prednisone as his symptoms did not improve. He finished the Prednisone 1 day prior to admission. His symptoms still did not improve but actually worsened and he presented to the ER with respiratory distress and hypoxia.He has required BiPAP and HFNC to achieve adequate oxygen saturations. He was started on treatment for CAP with Ceftriaxone and azithromycin.Influenza positive, not started on Tamiflu 09/21/2017 patient was due to go home today.  But he had 10-14 beats of wide-complex tachycardia by telemetry.  I have ordered an EKG and electrolytes since this morning.  And I have called in a cardiology consult to Dr. Edgardo Roys.  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 Eden Emms.    Assessment & Plan:   Principal Problem:   Acute respiratory failure with hypoxia (HCC) Active Problems:   CAD (coronary artery disease)   Multifocal pneumonia   Elevated LFTs   Elevated troponin   Elevated transaminase level  Multifocal pneumonia Influenza infection-repeat chest x-ray today shows patchy bilateral pulmonary infiltrates with slight interim clearing. Sputum culture unremarkable. Blood cultures (2/20) no growth to date. Afebrile. Never started on Tamiflu -Continue Ceftriaxon/azithromycin -Pulm recommendations: Solu-medrol taper  Acute respiratory failure with hypoxia-patient currently on 5 L nasal cannula.. Continues BiPAP overnight -Wean O2 as able -BIPAP qhs -Patient does have CPAP at home.  CAD/wide-complex tachycardia cardiology reconsulted this morning. Cardiology consulted initially for mild troponin  elevation. Recommended outpatient follow-up. -Continue aspirin  Essential hypertension Patient on Coreg -Coreg dose per below  Bradycardia Sinus bradycardia in setting of Coreg.Continues to have bradycardia at night but is asymptomatic. -Continue Coreg to 6.25 mg BID   Elevated LFTs Possiblysecondary to influenza infection.Liver function test improving. Follow-up tomorrow. Still elevated. Hepatitis panel negative. No associated elevated alkaline phosphatase.Hepatic steatosis on ultrasound    DVT prophylaxis: Lovenox Code Status full code Family Communication:  discussed with his wife Albaugh on the phone.   Disposition Plan: TBD  Consultants: P CCM, cardiology  Procedures: None Antimicrobials antibiotics stopped yesterday.  Subjective: Patient denies any complaints of chest pain shortness of breath nausea vomiting headaches or palpitations this morning.  When I saw him he still had the BiPAP on and appeared quite comfortable.  Objective: Vitals:   09/21/17 0358 09/21/17 0748 09/21/17 0845 09/21/17 0848  BP:   129/73   Pulse:  (!) 52  62  Resp: 15 16    Temp:   98.3 F (36.8 C)   TempSrc:   Oral   SpO2:  98%    Weight:      Height:        Intake/Output Summary (Last 24 hours) at 09/21/2017 1004 Last data filed at 09/20/2017 1420 Gross per 24 hour  Intake 360 ml  Output 675 ml  Net -315 ml   Filed Weights   09/11/17 1122 09/11/17 2238  Weight: 119.3 kg (263 lb 0.1 oz) 119.6 kg (263 lb 10.7 oz)    Examination:  General exam: Appears calm and comfortable  Respiratory system: Clear to auscultation. Respiratory effort normal. Cardiovascular system: S1 & S2 heard, RRR. No JVD, murmurs, rubs, gallops or clicks. No pedal edema. Gastrointestinal system: Abdomen  is nondistended, soft and nontender. No organomegaly or masses felt. Normal bowel sounds heard. Central nervous system: Alert and oriented. No focal neurological deficits. Extremities: Symmetric 5 x 5  power. Skin: No rashes, lesions or ulcers Psychiatry: Judgement and insight appear normal. Mood & affect appropriate.     Data Reviewed: I have personally reviewed following labs and imaging studies  CBC: Recent Labs  Lab 09/15/17 0208 09/16/17 0243 09/18/17 0150 09/20/17 0253 09/21/17 0836  WBC 12.6* 11.3* 13.7* 11.5* 8.9  NEUTROABS  --   --   --  9.2*  --   HGB 12.3* 12.6* 13.3 12.5* 13.1  HCT 36.9* 37.7* 39.8 37.6* 39.5  MCV 86.8 86.3 87.3 86.4 87.2  PLT 262 269 335 303 306   Basic Metabolic Panel: Recent Labs  Lab 09/15/17 0208 09/16/17 0243 09/18/17 0150 09/19/17 0305 09/20/17 0253  NA 138 136 135 137 137  K 4.9 4.9 5.1 4.3 4.6  CL 102 100* 99* 102 101  CO2 28 27 28 26 26   GLUCOSE 162* 179* 173* 110* 107*  BUN 25* 19 21* 23* 20  CREATININE 0.82 0.68 0.66 0.71 0.66  CALCIUM 8.2* 8.3* 8.6* 8.2* 8.4*   GFR: Estimated Creatinine Clearance: 133.3 mL/min (by C-G formula based on SCr of 0.66 mg/dL). Liver Function Tests: Recent Labs  Lab 09/16/17 0243 09/18/17 0150 09/19/17 0305 09/20/17 0253  AST 45* 33 32 30  ALT 271* 203* 187* 171*  ALKPHOS 74 80 80 75  BILITOT 0.9 0.8 0.6 0.8  PROT 6.1* 6.2* 5.6* 5.9*  ALBUMIN 2.5* 2.7* 2.6* 2.6*   No results for input(s): LIPASE, AMYLASE in the last 168 hours. No results for input(s): AMMONIA in the last 168 hours. Coagulation Profile: No results for input(s): INR, PROTIME in the last 168 hours. Cardiac Enzymes: No results for input(s): CKTOTAL, CKMB, CKMBINDEX, TROPONINI in the last 168 hours. BNP (last 3 results) No results for input(s): PROBNP in the last 8760 hours. HbA1C: No results for input(s): HGBA1C in the last 72 hours. CBG: Recent Labs  Lab 09/18/17 1707  GLUCAP 144*   Lipid Profile: No results for input(s): CHOL, HDL, LDLCALC, TRIG, CHOLHDL, LDLDIRECT in the last 72 hours. Thyroid Function Tests: No results for input(s): TSH, T4TOTAL, FREET4, T3FREE, THYROIDAB in the last 72 hours. Anemia  Panel: No results for input(s): VITAMINB12, FOLATE, FERRITIN, TIBC, IRON, RETICCTPCT in the last 72 hours. Sepsis Labs: No results for input(s): PROCALCITON, LATICACIDVEN in the last 168 hours.  Recent Results (from the past 240 hour(s))  Culture, blood (routine x 2)     Status: None   Collection Time: 09/11/17 11:30 AM  Result Value Ref Range Status   Specimen Description   Final    BLOOD RIGHT ANTECUBITAL Performed at [facility], [address] [place], [place] [address]    Special Requests   Final    BOTTLES DRAWN AEROBIC AND ANAEROBIC Blood Culture adequate volume Performed at [facility], [address] [place], [place] [address]    Culture   Final    NO GROWTH 5 DAYS Performed at [facility], [address], [place], [place] [address]    Report Status 09/16/2017 FINAL  Final  Culture, blood (routine x 2)     Status: None   Collection Time: 09/11/17 11:45 AM  Result Value Ref Range Status   Specimen Description   Final    BLOOD BLOOD RIGHT HAND Performed at [facility], [address] [place], [place] [address]    Special Requests   Final    BOTTLES DRAWN AEROBIC AND ANAEROBIC Blood Culture adequate volume Performed at [facility], [address] [place], [place] [address]    Culture   Final    NO GROWTH 5 DAYS Performed at [facility], [address] [place], [place] [address]    Report Status 09/16/2017 FINAL  Final  MRSA PCR Screening     Status: None   Collection Time: 09/11/17 10:20 PM  Result Value Ref Range Status   MRSA by PCR NEGATIVE NEGATIVE Final    Comment:        The GeneXpert MRSA Assay (FDA approved for NASAL specimens only), is one component of a comprehensive MRSA colonization surveillance program. It is not intended to diagnose MRSA infection nor to guide or monitor treatment for MRSA infections. Performed at [facility], [address], [place], [place] [address]     Culture, sputum-assessment     Status: None   Collection Time: 09/12/17 12:36 AM  Result Value Ref Range Status   Specimen Description EXPECTORATED SPUTUM  Final   Special Requests NONE  Final   Sputum evaluation   Final    THIS SPECIMEN IS ACCEPTABLE FOR SPUTUM CULTURE Performed at [facility], [address] [place], [place] [address]    Report Status 09/12/2017 FINAL  Final  Culture, respiratory (NON-Expectorated)     Status: None   Collection Time: 09/12/17 12:36 AM  Result Value Ref Range Status   Specimen Description EXPECTORATED SPUTUM  Final   Special Requests NONE Reflexed from [id number]  Final   Gram Stain   Final    NO WBC SEEN RARE SQUAMOUS EPITHELIAL CELLS PRESENT FEW GRAM NEGATIVE RODS RARE GRAM POSITIVE COCCI IN CLUSTERS    Culture   Final    Consistent with normal respiratory flora. Performed at [facility], [address] [place], [place] [address]    Report Status 09/14/2017 FINAL  Final  Respiratory Panel by PCR     Status: Abnormal   Collection Time: 09/12/17 12:40 AM  Result Value Ref Range Status   Adenovirus NOT DETECTED NOT DETECTED Final   Coronavirus 229E NOT DETECTED NOT DETECTED Final   Coronavirus HKU1 NOT DETECTED NOT DETECTED Final   Coronavirus NL63 NOT DETECTED NOT DETECTED Final   Coronavirus OC43 NOT DETECTED NOT DETECTED Final   Metapneumovirus NOT DETECTED NOT DETECTED Final   Rhinovirus / Enterovirus NOT DETECTED NOT DETECTED Final   Influenza A DETECTED (A) NOT DETECTED Final   Influenza B NOT DETECTED NOT DETECTED Final   Parainfluenza Virus 1 NOT DETECTED NOT DETECTED Final   Parainfluenza Virus 2 NOT DETECTED NOT DETECTED Final   Parainfluenza Virus 3 NOT DETECTED NOT DETECTED Final   Parainfluenza Virus 4 NOT DETECTED NOT DETECTED Final   Respiratory Syncytial Virus NOT DETECTED NOT DETECTED Final   Bordetella pertussis NOT DETECTED NOT DETECTED Final   Chlamydophila pneumoniae NOT DETECTED NOT DETECTED Final    Mycoplasma pneumoniae NOT DETECTED NOT DETECTED Final    Comment: Performed at [facility], [address], [place], [place] [address]         Radiology Studies: Dg Chest Port 1 View  Result Date: 09/20/2017 CLINICAL DATA:  09/15/2017. EXAM: PORTABLE CHEST 1 VIEW COMPARISON:  09/15/2017. FINDINGS: Mediastinum and hilar structures stable. Heart size stable. Patchy bilateral pulmonary infiltrates are again noted.  Slight interim clearing. Low lung volumes with basilar atelectasis. No pleural effusion or pneumothorax. IMPRESSION: Patchy bilateral pulmonary infiltrates are again noted. Slight interim clearing. Low lung volumes with basilar atelectasis. Electronically Signed   By: Maisie Fus  Register   On: 09/20/2017 07:14        Scheduled Meds: . aspirin EC  81 mg Oral Daily  . carvedilol  6.25 mg Oral BID WC  . chlorhexidine  15 mL Mouth Rinse BID  . chlorpheniramine-HYDROcodone  5 mL Oral Q12H  . docusate sodium  100 mg Oral BID  . enoxaparin (LOVENOX) injection  40 mg Subcutaneous Q24H  . feeding supplement (ENSURE ENLIVE)  237 mL Oral BID BM  . ipratropium-albuterol  3 mL Nebulization BID  . mouth rinse  15 mL Mouth Rinse BID  . methylPREDNISolone (SOLU-MEDROL) injection  40 mg Intravenous Daily  . pneumococcal 23 valent vaccine  0.5 mL Intramuscular Tomorrow-1000  . protein supplement shake  11 oz Oral BID BM   Continuous Infusions:   LOS: 10 days     Alwyn Ren, MD [facility]  If 7PM-7AM, please contact night-coverage [email or web address] Password [email or web address] 09/21/2017, 10:04 AM

## 2017-09-21 NOTE — Progress Notes (Signed)
Pt had VT in the morning, Dr. Jerolyn Center made aware of it. Took EKG, K+ was 5.8 Mg+ was 2.3 and administered kayexalate 30mg  po then Pt had one time BM. Concerted cardiology and talked to patient & wife. There is order for echocardiogram bubble study which hasn't done yet. Pt walked to hall way with O2 2L [place], denied SOB while walking. HS [employer]

## 2017-09-22 ENCOUNTER — Other Ambulatory Visit (HOSPITAL_COMMUNITY): Payer: Federal, State, Local not specified - PPO

## 2017-09-22 ENCOUNTER — Inpatient Hospital Stay (HOSPITAL_COMMUNITY): Payer: Federal, State, Local not specified - PPO

## 2017-09-22 DIAGNOSIS — I351 Nonrheumatic aortic (valve) insufficiency: Secondary | ICD-10-CM | POA: Diagnosis not present

## 2017-09-22 DIAGNOSIS — J9601 Acute respiratory failure with hypoxia: Secondary | ICD-10-CM | POA: Diagnosis not present

## 2017-09-22 LAB — BASIC METABOLIC PANEL
ANION GAP: 10 (ref 5–15)
BUN: 21 mg/dL — ABNORMAL HIGH (ref 6–20)
CALCIUM: 8.5 mg/dL — AB (ref 8.9–10.3)
CO2: 27 mmol/L (ref 22–32)
CREATININE: 0.66 mg/dL (ref 0.61–1.24)
Chloride: 101 mmol/L (ref 101–111)
GFR calc Af Amer: >60 mL/min (ref >60)
Glucose, Bld: 124 mg/dL — ABNORMAL HIGH (ref 65–99)
Potassium: 4.3 mmol/L (ref 3.5–5.1)
Sodium: 138 mmol/L (ref 135–145)

## 2017-09-22 MED ORDER — PREDNISONE 10 MG PO TABS: 10.0000 mg | ORAL_TABLET | Freq: Every day | ORAL | 0 refills | Status: DC

## 2017-09-22 MED ORDER — ALBUTEROL SULFATE (2.5 MG/3ML) 0.083% IN NEBU: 2.5000 mg | INHALATION_SOLUTION | Freq: Four times a day (QID) | RESPIRATORY_TRACT | 0 refills | Status: DC | PRN

## 2017-09-22 NOTE — Progress Notes (Signed)
Progress Note  Patient Name: Jared Morse Date of Encounter: 09/22/2017  Primary Cardiologist: Eden Emms   Subjective   Feeling well without compliant. No further VT noted. Was hyperkalemic yesterday which has resolved with kayexalate.   Inpatient Medications    Scheduled Meds: . aspirin EC  81 mg Oral Daily  . carvedilol  12.5 mg Oral BID WC  . chlorhexidine  15 mL Mouth Rinse BID  . chlorpheniramine-HYDROcodone  5 mL Oral Q12H  . docusate sodium  100 mg Oral BID  . enoxaparin (LOVENOX) injection  40 mg Subcutaneous Q24H  . feeding supplement (ENSURE ENLIVE)  237 mL Oral BID BM  . ipratropium-albuterol  3 mL Nebulization BID  . mouth rinse  15 mL Mouth Rinse BID  . methylPREDNISolone (SOLU-MEDROL) injection  40 mg Intravenous Daily  . protein supplement shake  11 oz Oral BID BM   Continuous Infusions:  PRN Meds: acetaminophen, albuterol, guaiFENesin-dextromethorphan, ondansetron **OR** ondansetron (ZOFRAN) IV   Vital Signs    Vitals:   09/22/17 0000 09/22/17 0400 09/22/17 0748 09/22/17 0821  BP:    115/65  Pulse: 71 63 70 64  Resp: 14 12 13 18   Temp:    98.7 F (37.1 C)  TempSrc:    Oral  SpO2: 96% 96% 95% 98%  Weight:      Height:        Intake/Output Summary (Last 24 hours) at 09/22/2017 0847 Last data filed at 09/22/2017 0000 Gross per 24 hour  Intake 1405 ml  Output 2 ml  Net 1403 ml   Filed Weights   09/11/17 1122 09/11/17 2238  Weight: 263 lb 0.1 oz (119.3 kg) 263 lb 10.7 oz (119.6 kg)    Telemetry    Sinus rhythm- Personally Reviewed  ECG    None new- Personally Reviewed  Physical Exam   GEN: Well nourished, well developed, in no acute distress  HEENT: normal  Neck: no JVD, carotid bruits, or masses Cardiac: RRR; no murmurs, rubs, or gallops,no edema  Respiratory:  clear to auscultation bilaterally, normal work of breathing GI: soft, nontender, nondistended, + BS MS: no deformity or atrophy  Skin: warm and dry Neuro:  Strength and  sensation are intact Psych: euthymic mood, full affect   Labs    Chemistry Recent Labs  Lab 09/19/17 0305 09/20/17 0253 09/21/17 0836 09/21/17 2004 09/22/17 0227  NA 137 137 135  --  138  K 4.3 4.6 5.8* 4.4 4.3  CL 102 101 100*  --  101  CO2 26 26 24   --  27  GLUCOSE 110* 107* 83  --  124*  BUN 23* 20 20  --  21*  CREATININE 0.71 0.66 0.69  --  0.66  CALCIUM 8.2* 8.4* 8.3*  --  8.5*  PROT 5.6* 5.9* 6.2*  --   --   ALBUMIN 2.6* 2.6* 2.8*  --   --   AST 32 30 40  --   --   ALT 187* 171* 124*  --   --   ALKPHOS 80 75 73  --   --   BILITOT 0.6 0.8 2.4*  --   --   GFRNONAA >60 >60 >60  --  >60  GFRAA >60 >60 >60  --  >60  ANIONGAP 9 10 11   --  10     Hematology Recent Labs  Lab 09/18/17 0150 09/20/17 0253 09/21/17 0836  WBC 13.7* 11.5* 8.9  RBC 4.56 4.35 4.53  HGB 13.3 12.5* 13.1  HCT  39.8 37.6* 39.5  MCV 87.3 86.4 87.2  MCH 29.2 28.7 28.9  MCHC 33.4 33.2 33.2  RDW 15.0 14.6 15.2  PLT 335 303 306    Cardiac Enzymes Recent Labs  Lab 09/21/17 0836 09/21/17 1354 09/21/17 2004  TROPONINI <0.03 <0.03 <0.03   No results for input(s): TROPIPOC in the last 168 hours.   BNPNo results for input(s): BNP, PROBNP in the last 168 hours.   DDimer No results for input(s): DDIMER in the last 168 hours.   Radiology    No results found.  Cardiac Studies   Echo pending  Patient Profile     58 y.o. male with a history of end STEMI and PCI to the RCA in 2015 with an EF of 50-55% previously who presented to the hospital with respiratory failure and also had wide-complex tachycardia this morning.  Assessment & Plan    1.  VT: none since yesterday AM. Possibly due to hyperkalemia. TTE pending. If TTE with normal LVEF, would plan for discharge with follow up in clinic.  2.  Coronary artery disease: DES to the RCA in 2015. No current chest pain.  For questions or updates, please contact [facility] Please consult [email or web address] for contact info under  Cardiology/STEMI.   Romelia Bromell Elberta Fortis, MD 09/22/2017 8:49 AM Pager [phone]

## 2017-09-22 NOTE — Discharge Summary (Addendum)
Physician Discharge Summary  Jared Morse WUJ:[id number] DOB: 29-May-1961 DOA: 09/11/2017  PCP: Lenell Antu, DO  Admit date: 09/11/2017 Discharge date: 09/22/2017  Admitted From: Home Disposition: Home  Recommendations for Outpatient Follow-up:  1. Follow up with PCP in 1-2 weeks 2. Please obtain BMP/CBC in one week  Home Health none Equipment/Devices: Oxygen at 2 L Discharge Condition stable CODE STATUS full code Diet recommendation: Cardiac diet Brief/Interim Summary:56 y.o.malewithhistory of CAD status post stenting in 2014who was diagnosed with the flurecently. He did not receive Tamiflu as his physician stated that he was out of the window. He was eventually started on Augmentin and Prednisone as his symptoms did not improve. He finished the Prednisone 1 day prior to admission. His symptoms still did not improve but actually worsened and he presented to the ER with respiratory distress and hypoxia.He has required BiPAP and HFNC to achieve adequate oxygen saturations. He was started on treatment for CAP with Ceftriaxone and azithromycin.Influenza positive, not started on Tamiflu 09/21/2017 patient was due to go home today.  But he had 10-14 beats of wide-complex tachycardia by telemetry.  I have ordered an EKG and electrolytes since this morning.  And I have called in a cardiology consult to Dr. Edgardo Roys.  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 Eden Emms.  09/22/2017 patient was seen by cardiology and increase her dose of Coreg back to his home dose.  He did not have any further episodes of wide-complex tachycardia.  He is resting in bed in no acute distress denies any specific complaints asking to go home.  Echocardiogram has been ordered and is pending at this time.   Discharge Diagnoses:  Principal Problem:   Acute respiratory failure with hypoxia (HCC) Active Problems:   CAD (coronary artery disease)   Multifocal pneumonia   Elevated LFTs    Elevated troponin   Elevated transaminase level   VT (ventricular tachycardia) (HCC)  1] acute respiratory failure with hypoxia secondary to multifocal pneumonia/influenza A status post treatment with antibiotics.  Patient was not given Tamiflu as he was out of the window.  Patient will be discharged on 2 L of oxygen.  He will follow-up with his PCP and titrate his oxygen down to DC as he can tolerate.  His oxygen saturation dropped to 86% upon ambulation.  2] wide-complex tachycardia/V. tach secondary to hyperkalemia which has been treated and potassium back to normal levels today.  Coreg dose increased to 12.5 mg twice a day.  Patient will follow up with cardiology as an outpatient.  Echocardiogram ejection fraction 55%.Left ventricle: The cavity size was normal. Wall thickness was   normal. Systolic function was normal. The estimated ejection   fraction was in the range of 55% to 60%. Wall motion was normal;   there were no regional wall motion abnormalities. Left   ventricular diastolic function parameters were normal. - Aortic valve: There was mild regurgitation  3] hypertension stable continue Coreg  4] elevated liver function tests which was thought to be due to flow patient will need to monitor liver function tests as an outpatient.  Lipitor was not restarted during this hospital admission due to this.  5] history of CAD MI and drug-eluting stent in 2015 not on statin due to elevated liver function test.    Discharge Instructions  Discharge Instructions    Call MD for:  difficulty breathing, headache or visual disturbances   Complete by:  As directed  Call MD for:  extreme fatigue   Complete by:  As directed    Call MD for:  persistant dizziness or light-headedness   Complete by:  As directed    Call MD for:  persistant nausea and vomiting   Complete by:  As directed    Call MD for:  severe uncontrolled pain   Complete by:  As directed    Call MD for:  temperature >100.4    Complete by:  As directed    Diet - low sodium heart healthy   Complete by:  As directed    Increase activity slowly   Complete by:  As directed      Allergies as of 09/22/2017   No Known Allergies     Medication List    STOP taking these medications   amoxicillin-clavulanate 875-125 MG tablet Commonly known as:  AUGMENTIN   HYDROcodone-homatropine 5-1.5 MG/5ML syrup Commonly known as:  HYCODAN   ibuprofen 200 MG tablet Commonly known as:  ADVIL,MOTRIN   predniSONE 10 MG (48) Tbpk tablet Commonly known as:  STERAPRED UNI-PAK 48 TAB Replaced by:  predniSONE 10 MG tablet     TAKE these medications   acetaminophen 325 MG tablet Commonly known as:  TYLENOL Take 650 mg by mouth every 6 (six) hours as needed for mild pain.   aspirin 81 MG tablet Take 81 mg by mouth daily.   atorvastatin 80 MG tablet Commonly known as:  LIPITOR Take 1 tablet (80 mg total) by mouth daily. What changed:  when to take this   benzonatate 100 MG capsule Commonly known as:  TESSALON Take 100 mg by mouth 3 (three) times daily.   carvedilol 12.5 MG tablet Commonly known as:  COREG Take 1 tablet (12.5 mg total) by mouth 2 (two) times daily with a meal.   FLOVENT HFA 110 MCG/ACT inhaler Generic drug:  fluticasone Inhale 1 puff into the lungs 2 (two) times daily.   fluticasone 50 MCG/ACT nasal spray Commonly known as:  FLONASE Place 2 sprays into both nostrils daily.   ipratropium-albuterol 0.5-2.5 (3) MG/3ML Soln Commonly known as:  DUONEB Take 3 mLs by nebulization every 8 (eight) hours as needed for wheezing. X 15 days starting 2/19   nitroGLYCERIN 0.4 MG SL tablet Commonly known as:  NITROSTAT Place 0.4 mg under the tongue every 5 (five) minutes as needed for chest pain.   predniSONE 10 MG tablet Commonly known as:  DELTASONE Take 1 tablet (10 mg total) by mouth daily. Replaces:  predniSONE 10 MG (48) Tbpk tablet   PROAIR HFA 108 (90 Base) MCG/ACT inhaler Generic drug:   albuterol Inhale 1 puff into the lungs every 6 (six) hours as needed for wheezing.   albuterol (2.5 MG/3ML) 0.083% nebulizer solution Commonly known as:  PROVENTIL Take 3 mLs by nebulization 3 (three) times daily as needed for wheezing.            Durable Medical Equipment  (From admission, onward)        Start     Ordered   09/22/17 0950  DME Oxygen  Once    Question Answer Comment  Mode or (Route) Nasal cannula   Liters per Minute 2   Frequency Continuous (stationary and portable oxygen unit needed)   Oxygen conserving device No   Oxygen delivery system Gas      09/22/17 0949   09/20/17 1156  For home use only DME oxygen  Once    Question Answer Comment  Mode or (  Route) Nasal cannula   Liters per Minute 2   Frequency Continuous (stationary and portable oxygen unit needed)   Oxygen conserving device Yes   Oxygen delivery system Gas      09/20/17 1156     Follow-up Information    [facility] - Dme Follow up.   Why:  oxygen Contact information: [address] [place] [address] [phone]        Hamilton Capri P, DO Follow up.   Specialty:  Family Medicine Contact information: [address] [place] [address] [phone]        Wendall Stade, MD Follow up.   Specialty:  Cardiology Contact information: [address] [place] [address] [phone]          No Known Allergies  Consultations: Cardiology/pccm  Procedures/Studies: Dg Chest 2 View  Result Date: 09/11/2017 CLINICAL DATA:  Flu like symptoms for several days.  Dizziness. EXAM: CHEST  2 VIEW COMPARISON:  06/29/2014 FINDINGS: The heart size appears normal. No pleural effusion or edema identified. Numerous nodular opacities are identified throughout both lungs. Findings are suspicious for either metastatic disease or atypical infection. Peripheral area of wedge-shaped consolidation within the right lower lobe identified. IMPRESSION: 1.  Diffuse bilateral nodular opacities in both lungs are identified. Cannot rule out pulmonary metastasis versus atypical infection. Further evaluation with contrast enhanced CT of the chest is advised. Electronically Signed   By: Signa Kell M.D.   On: 09/11/2017 12:11   Ct Chest W Contrast  Result Date: 09/11/2017 CLINICAL DATA:  The patient was diagnosed with the flu last week. Worsening cough over the past few days with chest pain and shortness of breath. EXAM: CT CHEST WITH CONTRAST TECHNIQUE: Multidetector CT imaging of the chest was performed during intravenous contrast administration. CONTRAST:  100 ml ISOVUE-300 IOPAMIDOL (ISOVUE-300) INJECTION 61% COMPARISON:  PA and lateral chest earlier today. FINDINGS: Cardiovascular: No pericardial effusion. Heart size is mildly enlarged. There is calcific coronary artery disease. No calcific aortic atherosclerosis is seen. No aneurysm. Mediastinum/Nodes: Thyroid gland appears normal. No hiatal hernia. Right hilar lymph node on image 56 of series 2 measures 1.6 cm short axis dimension. A small left hilar lymph node is also identified. No axillary lymphadenopathy. Lungs/Pleura: The patient has multifocal airspace disease most consistent with pneumonia. No nodule or mass is identified. No pleural effusion. Upper Abdomen: Low-attenuation of the liver is consistent with fatty infiltration. No acute abnormality in the visualized abdomen is seen. Musculoskeletal: Negative. IMPRESSION: Extensive bilateral airspace disease consistent with multifocal pneumonia. No evidence of neoplastic process is identified. Recommend plain film follow-up to clearing. Mildly prominent right hilar lymph node is presumably reactive. Calcific coronary atherosclerosis. Mild cardiomegaly. Fatty infiltration of the liver. Electronically Signed   By: Drusilla Kanner M.D.   On: 09/11/2017 12:58   Dg Chest Port 1 View  Result Date: 09/20/2017 CLINICAL DATA:  09/15/2017. EXAM: PORTABLE CHEST 1  VIEW COMPARISON:  09/15/2017. FINDINGS: Mediastinum and hilar structures stable. Heart size stable. Patchy bilateral pulmonary infiltrates are again noted. Slight interim clearing. Low lung volumes with basilar atelectasis. No pleural effusion or pneumothorax. IMPRESSION: Patchy bilateral pulmonary infiltrates are again noted. Slight interim clearing. Low lung volumes with basilar atelectasis. Electronically Signed   By: Maisie Fus  Register   On: 09/20/2017 07:14   Dg Chest Port 1 View  Result Date: 09/15/2017 CLINICAL DATA:  Follow-up pneumonia and shortness of breath. EXAM: PORTABLE CHEST 1 VIEW COMPARISON:  09/11/2017 FINDINGS: Heart size is normal. Widespread patchy bilateral pulmonary infiltrates persist, left more than right, consistent with bronchopneumonia. No worsening or new finding. IMPRESSION: No change allowing for technical differences. Widespread patchy bilateral bronchopneumonia left worse than right. Electronically Signed   By: Paulina Fusi M.D.   On: 09/15/2017 06:40   US Abdomen Limited Ruq  Result Date: 09/16/2017 CLINICAL DATA:  Elevated LFTs. EXAM: ULTRASOUND ABDOMEN LIMITED RIGHT UPPER QUADRANT COMPARISON:  None. FINDINGS: Gallbladder: No gallstones or wall thickening visualized. No sonographic Murphy sign noted by sonographer. Common bile duct: Diameter: 5.5 mm, normal. Liver: No focal lesion identified. Increased in parenchymal echogenicity. Portal vein is patent on color Doppler imaging with normal direction of blood flow towards the liver. IMPRESSION: 1. No acute abnormality. 2. Hepatic steatosis. Electronically Signed   By: Obie Dredge M.D.   On: 09/16/2017 10:01    (Echo, Carotid, EGD, Colonoscopy, ERCP)    Subjective:   Discharge Exam: Vitals:   09/22/17 0748 09/22/17 0821  BP:  115/65  Pulse: 70 64  Resp: 13 18  Temp:  98.7 F (37.1 C)  SpO2: 95% 98%   Vitals:   09/22/17 0000 09/22/17 0400 09/22/17 0748 09/22/17 0821  BP:    115/65  Pulse: 71 63 70 64   Resp: 14 12 13 18   Temp:    98.7 F (37.1 C)  TempSrc:    Oral  SpO2: 96% 96% 95% 98%  Weight:      Height:        General: Pt is alert, awake, not in acute distress Cardiovascular: RRR, S1/S2 +, no rubs, no gallops Respiratory: CTA bilaterally, no wheezing, no rhonchi Abdominal: Soft, NT, ND, bowel sounds + Extremities: no edema, no cyanosis    The results of significant diagnostics from this hospitalization (including imaging, microbiology, ancillary and laboratory) are listed below for reference.     Microbiology: No results found for this or any previous visit (from the past 240 hour(s)).   Labs: BNP (last 3 results) No results for input(s): BNP in the last 8760 hours. Basic Metabolic Panel: Recent Labs  Lab 09/18/17 0150 09/19/17 0305 09/20/17 0253 09/21/17 0836 09/21/17 2004 09/22/17 0227  NA 135 137 137 135  --  138  K 5.1 4.3 4.6 5.8* 4.4 4.3  CL 99* 102 101 100*  --  101  CO2 28 26 26 24   --  27  GLUCOSE 173* 110* 107* 83  --  124*  BUN 21* 23* 20 20  --  21*  CREATININE 0.66 0.71 0.66 0.69  --  0.66  CALCIUM 8.6* 8.2* 8.4* 8.3*  --  8.5*  MG  --   --   --  2.3  --   --    Liver Function Tests: Recent Labs  Lab 09/16/17 0243 09/18/17 0150 09/19/17 0305 09/20/17 0253 09/21/17 0836  AST 45* 33 32 30 40  ALT 271* 203* 187* 171* 124*  ALKPHOS 74 80 80 75 73  BILITOT 0.9 0.8 0.6 0.8 2.4*  PROT 6.1* 6.2* 5.6* 5.9* 6.2*  ALBUMIN 2.5* 2.7* 2.6* 2.6* 2.8*   No results for input(s): LIPASE, AMYLASE in the last 168 hours. No results for input(s): AMMONIA in the last 168 hours. CBC: Recent Labs  Lab 09/16/17 0243 09/18/17 0150 09/20/17 0253 09/21/17 0836  WBC 11.3* 13.7* 11.5* 8.9  NEUTROABS  --   --  9.2*  --   HGB 12.6* 13.3 12.5* 13.1  HCT 37.7* 39.8 37.6* 39.5  MCV  86.3 87.3 86.4 87.2  PLT 269 335 303 306   Cardiac Enzymes: Recent Labs  Lab 09/21/17 0836 09/21/17 1354 09/21/17 2004  TROPONINI <0.03 <0.03 <0.03   BNP: Invalid  input(s): POCBNP CBG: Recent Labs  Lab 09/18/17 1707  GLUCAP 144*   D-Dimer No results for input(s): DDIMER in the last 72 hours. Hgb A1c No results for input(s): HGBA1C in the last 72 hours. Lipid Profile No results for input(s): CHOL, HDL, LDLCALC, TRIG, CHOLHDL, LDLDIRECT in the last 72 hours. Thyroid function studies No results for input(s): TSH, T4TOTAL, T3FREE, THYROIDAB in the last 72 hours.  Invalid input(s): FREET3 Anemia work up No results for input(s): VITAMINB12, FOLATE, FERRITIN, TIBC, IRON, RETICCTPCT in the last 72 hours. Urinalysis    Component Value Date/Time   COLORURINE YELLOW 09/11/2017 1312   APPEARANCEUR CLEAR 09/11/2017 1312   LABSPEC 1.025 09/11/2017 1312   PHURINE 6.0 09/11/2017 1312   GLUCOSEU NEGATIVE 09/11/2017 1312   HGBUR NEGATIVE 09/11/2017 1312   BILIRUBINUR NEGATIVE 09/11/2017 1312   KETONESUR 15 (A) 09/11/2017 1312   PROTEINUR 100 (A) 09/11/2017 1312   NITRITE NEGATIVE 09/11/2017 1312   LEUKOCYTESUR NEGATIVE 09/11/2017 1312   Sepsis Labs Invalid input(s): PROCALCITONIN,  WBC,  LACTICIDVEN Microbiology No results found for this or any previous visit (from the past 240 hour(s)).   Time coordinating discharge: Over 30 minutes  SIGNED:   Alwyn Ren, MD  [facility] 09/22/2017, 10:01 AM  If 7PM-7AM, please contact night-coverage [email or web address] Password [email or web address]

## 2017-09-22 NOTE — Progress Notes (Signed)
  Echocardiogram 2D Echocardiogram has been performed.  Jared Morse T [name] 09/22/2017, 11:13 AM

## 2017-09-22 NOTE — Progress Notes (Signed)
Discharge instructions reviewed in detail w/patient, including medication administration, RX, and follow-up appointments.  All questions answered.  Patient removed from telemetry; PIV removed.  Patient given permission to shower independently per his request w/safety instructions given - request patient use call light for any assistance needs, linens and toiletries provided, and patient encouraged to wear gripper socks before and after shower while in room.  Patient has called wife for transport home.  Oxygen for home use was delivered by [facility] yesterday and is available in room for patient to transport home.

## 2017-09-25 DIAGNOSIS — J9601 Acute respiratory failure with hypoxia: Secondary | ICD-10-CM | POA: Diagnosis not present

## 2017-09-26 DIAGNOSIS — Z09 Encounter for follow-up examination after completed treatment for conditions other than malignant neoplasm: Secondary | ICD-10-CM | POA: Diagnosis not present

## 2017-09-26 DIAGNOSIS — R062 Wheezing: Secondary | ICD-10-CM | POA: Diagnosis not present

## 2017-09-26 DIAGNOSIS — J111 Influenza due to unidentified influenza virus with other respiratory manifestations: Secondary | ICD-10-CM | POA: Diagnosis not present

## 2017-09-26 DIAGNOSIS — R945 Abnormal results of liver function studies: Secondary | ICD-10-CM | POA: Diagnosis not present

## 2017-09-26 DIAGNOSIS — J181 Lobar pneumonia, unspecified organism: Secondary | ICD-10-CM | POA: Diagnosis not present

## 2017-10-04 DIAGNOSIS — J181 Lobar pneumonia, unspecified organism: Secondary | ICD-10-CM | POA: Diagnosis not present

## 2017-10-04 DIAGNOSIS — I2510 Atherosclerotic heart disease of native coronary artery without angina pectoris: Secondary | ICD-10-CM | POA: Diagnosis not present

## 2017-10-04 DIAGNOSIS — R945 Abnormal results of liver function studies: Secondary | ICD-10-CM | POA: Diagnosis not present

## 2017-10-04 DIAGNOSIS — R739 Hyperglycemia, unspecified: Secondary | ICD-10-CM | POA: Diagnosis not present

## 2017-10-10 DIAGNOSIS — G4733 Obstructive sleep apnea (adult) (pediatric): Secondary | ICD-10-CM | POA: Diagnosis not present

## 2017-10-14 DIAGNOSIS — G4733 Obstructive sleep apnea (adult) (pediatric): Secondary | ICD-10-CM | POA: Diagnosis not present

## 2017-10-18 ENCOUNTER — Ambulatory Visit (INDEPENDENT_AMBULATORY_CARE_PROVIDER_SITE_OTHER): Payer: Federal, State, Local not specified - PPO | Admitting: Pulmonary Disease

## 2017-10-18 ENCOUNTER — Institutional Professional Consult (permissible substitution): Payer: Federal, State, Local not specified - PPO | Admitting: Pulmonary Disease

## 2017-10-18 ENCOUNTER — Encounter: Payer: Self-pay | Admitting: Pulmonary Disease

## 2017-10-18 VITALS — BP 124/70 | HR 68 | Ht 69.0 in | Wt 262.6 lb

## 2017-10-18 DIAGNOSIS — R0600 Dyspnea, unspecified: Secondary | ICD-10-CM | POA: Diagnosis not present

## 2017-10-18 DIAGNOSIS — J9601 Acute respiratory failure with hypoxia: Secondary | ICD-10-CM

## 2017-10-18 DIAGNOSIS — J189 Pneumonia, unspecified organism: Secondary | ICD-10-CM

## 2017-10-18 DIAGNOSIS — J4540 Moderate persistent asthma, uncomplicated: Secondary | ICD-10-CM

## 2017-10-18 NOTE — Progress Notes (Signed)
Subjective:   PATIENT ID: Jared Morse GENDER: male DOB: Jun 15, 1961, MRN: [id number]  Synopsis: Referred in 09/2017 for hypoxemia after a hospitalization for the flu in early 2019   HPI  Chief Complaint  Patient presents with  . 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  He says that he had symptoms for several days and ended up going to the doctor several times and was treated with Tamiflu and other medicines.  He was treated with antibiotics as well.  He was in and out of his doctor's office and in out of emergency rooms.  He is actually seen in emergency room and told to go home but then within 24 hours he came back to the [facility] emergency department with severe community acquired pneumonia symptoms including severe acute hypoxemic respiratory failure.  He was admitted to the MedSurg floor on high flow oxygen but had to be transferred to the ICU for closer monitoring.  Fortunately he never developed the need for mechanical ventilation.  With careful monitoring, holding Tamiflu, and using broad-spectrum antibiotics he made a slow improvement.  He was discharged on oxygen.  During that time because he had increased chest tightness wheezing and shortness of breath he was treated with Flovent and as needed albuterol.  Since coming home he says that he still had some rhonchi and mucus production and his primary care physician treated him with another round of antibiotics not long after hospital discharge.  He is slowly making improvement.  He still has some sensation of shortness of breath and fatigue and some chest tightness but overall this is dramatically better.  He is back to  work.  He still is using albuterol twice a day.  He is using Flovent only 1 times a day at this point.  He is using CPAP at night with 4 L of oxygen bled in.  He has been using oxygen with exertion up until today.  Past Medical History:  Diagnosis Date  . CAD (coronary artery disease)    EF = 0.40%,mild AR,RVSP=73mmHG  ECHO 08/26/13  . Coronary artery disease    consistent with angina/ NON STEMI (non ST elevated myocardial infaraction)  . Heart disease   . Myocardial infarction [facility]) 07/2013     Family History  Problem Relation Age of Onset  . Hypertension Mother      Social History   Socioeconomic History  . Marital status: Married    Spouse name: Not on file  . Number of children: Not on file  . Years of education: Not on file  . Highest education level: Not on file  Occupational History  . Not on file  Social Needs  . Financial resource strain: Not on file  . Food insecurity:    Worry: Not on file    Inability: Not on file  . Transportation needs:    Medical: Not on file    Non-medical: Not on file  Tobacco Use  . Smoking status: Never Smoker  . Smokeless tobacco: Never Used  Substance and Sexual Activity  . Alcohol use: No  . Drug use: No  . Sexual activity: Not on file  Lifestyle  . Physical activity:  Days per week: Not on file    Minutes per session: Not on file  . Stress: Not on file  Relationships  . Social connections:    Talks on phone: Not on file    Gets together: Not on file    Attends religious service: Not on file    Active member of club or organization: Not on file    Attends meetings of clubs or organizations: Not on file    Relationship status: Not on file  . Intimate partner violence:    Fear of current or ex partner: Not on file    Emotionally abused: Not on file    Physically abused: Not on file    Forced sexual activity: Not on file  Other Topics Concern  . Not on file  Social History Narrative  . Not on file     No Known  Allergies   Outpatient Medications Prior to Visit  Medication Sig Dispense Refill  . acetaminophen (TYLENOL) 325 MG tablet Take 650 mg by mouth every 6 (six) hours as needed for mild pain.    Marland Kitchen albuterol (PROVENTIL) (2.5 MG/3ML) 0.083% nebulizer solution Take 3 mLs by nebulization 3 (three) times daily as needed for wheezing.  0  . albuterol (PROVENTIL) (2.5 MG/3ML) 0.083% nebulizer solution Take 3 mLs (2.5 mg total) by nebulization every 6 (six) hours as needed for wheezing or shortness of breath. 75 mL 0  . aspirin 81 MG tablet Take 81 mg by mouth daily.    Marland Kitchen atorvastatin (LIPITOR) 80 MG tablet Take 80 mg by mouth daily.    . carvedilol (COREG) 12.5 MG tablet Take 1 tablet (12.5 mg total) by mouth 2 (two) times daily with a meal. 180 tablet 3  . FLOVENT HFA 110 MCG/ACT inhaler Inhale 1 puff into the lungs 2 (two) times daily.  0  . fluticasone (FLONASE) 50 MCG/ACT nasal spray Place 2 sprays into both nostrils daily.  0  . nitroGLYCERIN (NITROSTAT) 0.4 MG SL tablet Place 0.4 mg under the tongue every 5 (five) minutes as needed for chest pain.  12  . PROAIR HFA 108 (90 Base) MCG/ACT inhaler Inhale 1 puff into the lungs every 6 (six) hours as needed for wheezing.  3  . benzonatate (TESSALON) 100 MG capsule Take 100 mg by mouth 3 (three) times daily.  0  . ipratropium-albuterol (DUONEB) 0.5-2.5 (3) MG/3ML SOLN Take 3 mLs by nebulization every 8 (eight) hours as needed for wheezing. X 15 days starting 2/19  0  . predniSONE (DELTASONE) 10 MG tablet Take 1 tablet (10 mg total) by mouth daily. (Patient not taking: Reported on 10/18/2017) 10 tablet 0   No facility-administered medications prior to visit.     Review of Systems  Constitutional: Negative for chills, fever, malaise/fatigue and weight loss.  HENT: Negative for congestion, nosebleeds, sinus pain and sore throat.   Eyes: Negative for photophobia, pain and discharge.  Respiratory: Positive for cough and shortness of breath. Negative for  hemoptysis, sputum production and wheezing.   Cardiovascular: Negative for chest pain, palpitations, orthopnea and leg swelling.  Gastrointestinal: Negative for abdominal pain, constipation, diarrhea, nausea and vomiting.  Genitourinary: Negative for dysuria, frequency, hematuria and urgency.  Musculoskeletal: Negative for back pain, joint pain, myalgias and neck pain.  Skin: Negative for itching and rash.  Neurological: Negative for tingling, tremors, sensory change, speech change, focal weakness, seizures, weakness and headaches.  Psychiatric/Behavioral: Negative for memory loss, substance abuse and suicidal ideas. The patient is not nervous/anxious.  Objective:  Physical Exam   Vitals:   10/18/17 1613  BP: 124/70  Pulse: 68  SpO2: 95%  Weight: 262 lb 9.6 oz (119.1 kg)  Height: 5\' 9"  (1.753 m)    Gen: well appearing, no acute distress HENT: NCAT, OP clear, neck supple without masses Eyes: PERRL, EOMi Lymph: no cervical lymphadenopathy PULM: Crackles bases bilaterally B CV: RRR, no mgr, no JVD GI: BS+, soft, nontender, no hsm Derm: no rash or skin breakdown MSK: normal bulk and tone Neuro: A&Ox4, CN II-XII intact, strength 5/5 in all 4 extremities Psyche: normal mood and affect   CBC    Component Value Date/Time   WBC 8.9 09/21/2017 0836   RBC 4.53 09/21/2017 0836   HGB 13.1 09/21/2017 0836   HCT 39.5 09/21/2017 0836   PLT 306 09/21/2017 0836   MCV 87.2 09/21/2017 0836   MCH 28.9 09/21/2017 0836   MCHC 33.2 09/21/2017 0836   RDW 15.2 09/21/2017 0836   LYMPHSABS 1.5 09/20/2017 0253   MONOABS 0.8 09/20/2017 0253   EOSABS 0.0 09/20/2017 0253   BASOSABS 0.0 09/20/2017 0253     Chest imaging: CT chest February 2019 showed diffuse multifocal infiltrates consistent with severe community-acquired pneumonia  PFT:  Labs:  Path:  Echo:  Heart Catheterization:  Records from his recent hospital stay reviewed, he was treated for multifocal CAP with  antibiotics, noted to be Influenza positive.  Discharged on O2.     Assessment & Plan:   Dyspnea, unspecified type - Plan: DG Chest 2 View  Moderate persistent asthma without complication  Multifocal pneumonia  Community acquired pneumonia, unspecified laterality  Acute respiratory failure with hypoxemia (HCC)  Discussion: Discussion: Jared Morse is slowly improving since his episode of severe community-acquired pneumonia which required hospitalization and ICU stay.  He is progressing as we would expect with the understanding of the natural history of pneumonia.  He still has some symptoms of fatigue and cough and mild shortness of breath which is completely in keeping with the natural history of pneumonia.  I explained to him that this process will take weeks to months to resolve and I have no indication of getting a chest x-ray today because he is slowly making improvement.  I think we can get a repeat chest x-ray on the next visit in 4 weeks.  Today with ambulation his oxygenation was normal which is great.  We told him to stop using it when he walks and during the daytime.  However, I would like for him to continue using it when he sleeps at night.  He is compliant with CPAP and he should continue using this.  I think his asthma symptoms are a bit worse since the pneumonia so of asked him to take the Flovent twice a day to keep this under better control and to continue using albuterol.  We have clarified appropriate use of albuterol nebulizers and HFA inhalers.  Plan: Obstructive sleep apnea: Keep taking CPAP nightly with 4 L of oxygen When you come back in 4 weeks we will make arrangements to check your oxygen level while you are sleeping on CPAP alone without oxygen  Community-acquired pneumonia: On the next visit we will get a chest x-ray  Asthma: I would like for you to take the Flovent twice a day no matter how you feel Use albuterol nebulized or HFA inhaler every 4-6 hours as  needed for chest tightness wheezing or shortness of breath  We will see you back in 4 weeks or sooner  if needed    Current Outpatient Medications:  .  acetaminophen (TYLENOL) 325 MG tablet, Take 650 mg by mouth every 6 (six) hours as needed for mild pain., Disp: , Rfl:  .  albuterol (PROVENTIL) (2.5 MG/3ML) 0.083% nebulizer solution, Take 3 mLs by nebulization 3 (three) times daily as needed for wheezing., Disp: , Rfl: 0 .  albuterol (PROVENTIL) (2.5 MG/3ML) 0.083% nebulizer solution, Take 3 mLs (2.5 mg total) by nebulization every 6 (six) hours as needed for wheezing or shortness of breath., Disp: 75 mL, Rfl: 0 .  aspirin 81 MG tablet, Take 81 mg by mouth daily., Disp: , Rfl:  .  atorvastatin (LIPITOR) 80 MG tablet, Take 80 mg by mouth daily., Disp: , Rfl:  .  carvedilol (COREG) 12.5 MG tablet, Take 1 tablet (12.5 mg total) by mouth 2 (two) times daily with a meal., Disp: 180 tablet, Rfl: 3 .  FLOVENT HFA 110 MCG/ACT inhaler, Inhale 1 puff into the lungs 2 (two) times daily., Disp: , Rfl: 0 .  fluticasone (FLONASE) 50 MCG/ACT nasal spray, Place 2 sprays into both nostrils daily., Disp: , Rfl: 0 .  nitroGLYCERIN (NITROSTAT) 0.4 MG SL tablet, Place 0.4 mg under the tongue every 5 (five) minutes as needed for chest pain., Disp: , Rfl: 12 .  PROAIR HFA 108 (90 Base) MCG/ACT inhaler, Inhale 1 puff into the lungs every 6 (six) hours as needed for wheezing., Disp: , Rfl: 3 .  benzonatate (TESSALON) 100 MG capsule, Take 100 mg by mouth 3 (three) times daily., Disp: , Rfl: 0 .  ipratropium-albuterol (DUONEB) 0.5-2.5 (3) MG/3ML SOLN, Take 3 mLs by nebulization every 8 (eight) hours as needed for wheezing. X 15 days starting 2/19, Disp: , Rfl: 0 .  predniSONE (DELTASONE) 10 MG tablet, Take 1 tablet (10 mg total) by mouth daily. (Patient not taking: Reported on 10/18/2017), Disp: 10 tablet, Rfl: 0

## 2017-10-18 NOTE — Patient Instructions (Signed)
Obstructive sleep apnea: Keep taking CPAP nightly with 4 L of oxygen When you come back in 4 weeks we will make arrangements to check your oxygen level while you are sleeping on CPAP alone without oxygen  Community-acquired pneumonia:  On the next visit we will get a chest x-ray As stated today in clinic this process will take weeks to months to improve.  Asthma: I would like for you to take the Flovent twice a day no matter how you feel Use albuterol nebulized or HFA inhaler every 4-6 hours as needed for chest tightness wheezing or shortness of breath  We will see you back in 4 weeks or sooner if needed

## 2017-10-22 NOTE — Progress Notes (Signed)
Patient ID: Jared Morse, male   DOB: [birth year], 57 y.o.   MRN: [id number]   56 y.o. last seen in 2017.  History of CAD, MR and HTN.  MI occurred in [place] with 90% proximal RCA and 50-60% mid LAD After large D1. Echo with EF 40% mild AR no MR. Myovue 07/27/14 normal EF 49% Post MI echo 07/26/14 EF normalized 55-60% with trivial MR 57 y.o.  with MI in [place] end of January 2015 .  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 Seeing Dr Jared Morse. Saw Jared Morse in hospital for NSVT That was asymptomatic. Echo updated reviewed 09/22/17 EF 55-60% put back on home dose of beta blocker No chest pain no enzyme elevation during hospital stay.   Still with some cough and dyspnea    ROS: Denies fever, malais, weight loss, blurry vision, decreased visual acuity, cough, sputum, SOB, hemoptysis, pleuritic pain, palpitaitons, heartburn, abdominal pain, melena, lower extremity edema, claudication, or rash.  All other systems reviewed and negative   General: BP (!) 152/82   Pulse 67   Ht 5\' 9"  (1.753 m)   Wt 258 lb (117 kg)   SpO2 97%   BMI 38.10 kg/m  Affect appropriate [name] HEENT: normal Neck supple with no adenopathy JVP normal no bruits no thyromegaly Lungs clear with no wheezing and good diaphragmatic motion Heart:  S1/S2 no murmur, no rub, gallop or click PMI normal Abdomen: benighn, BS positve, no tenderness, no AAA no bruit.  No HSM or HJR Distal pulses intact with no bruits No edema Neuro non-focal Skin warm and dry No muscular weakness   Medications Current Outpatient Medications  Medication Sig Dispense Refill  . albuterol (PROVENTIL) (2.5 MG/3ML) 0.083% nebulizer solution Take 3 mLs by nebulization 3 (three) times daily as needed for wheezing.  0  . albuterol  (PROVENTIL) (2.5 MG/3ML) 0.083% nebulizer solution Take 3 mLs (2.5 mg total) by nebulization every 6 (six) hours as needed for wheezing or shortness of breath. 75 mL 0  . aspirin 81 MG tablet Take 81 mg by mouth daily.    Marland Kitchen atorvastatin (LIPITOR) 80 MG tablet Take 80 mg by mouth daily.    . carvedilol (COREG) 12.5 MG tablet Take 1 tablet (12.5 mg total) by mouth 2 (two) times daily with a meal. 180 tablet 3  . FLOVENT HFA 110 MCG/ACT inhaler Inhale 1 puff into the lungs 2 (two) times daily.  0  . fluticasone (FLONASE) 50 MCG/ACT nasal spray Place 2 sprays into both nostrils daily.  0  . ipratropium-albuterol (DUONEB) 0.5-2.5 (3) MG/3ML SOLN Take 3 mLs by nebulization every 8 (eight) hours as needed for wheezing. X 15 days starting 2/19  0  . nitroGLYCERIN (NITROSTAT) 0.4 MG SL tablet Place 0.4 mg under the tongue every 5 (five) minutes as needed for chest pain.  12  . PROAIR HFA 108 (90 Base) MCG/ACT inhaler Inhale 1 puff into the lungs every 6 (six) hours as needed for wheezing.  3   No current facility-administered medications for this visit.     Allergies Patient has no known allergies.  Family History: Family History  Problem Relation Age of Onset  . Hypertension Mother     Social History: Social History   Socioeconomic History  . Marital status: Married  Spouse name: Not on file  . Number of children: Not on file  . Years of education: Not on file  . Highest education level: Not on file  Occupational History  . Not on file  Social Needs  . Financial resource strain: Not on file  . Food insecurity:    Worry: Not on file    Inability: Not on file  . Transportation needs:    Medical: Not on file    Non-medical: Not on file  Tobacco Use  . Smoking status: Never Smoker  . Smokeless tobacco: Never Used  Substance and Sexual Activity  . Alcohol use: No  . Drug use: No  . Sexual activity: Not on file  Lifestyle  . Physical activity:    Days per week: Not on file     Minutes per session: Not on file  . Stress: Not on file  Relationships  . Social connections:    Talks on phone: Not on file    Gets together: Not on file    Attends religious service: Not on file    Active member of club or organization: Not on file    Attends meetings of clubs or organizations: Not on file    Relationship status: Not on file  . Intimate partner violence:    Fear of current or ex partner: Not on file    Emotionally abused: Not on file    Physically abused: Not on file    Forced sexual activity: Not on file  Other Topics Concern  . Not on file  Social History Narrative  . Not on file    Past Surgical History:  Procedure Laterality Date  . CORONARY ANGIOPLASTY WITH STENT PLACEMENT  08/06/13   DES to RCA in setting of NSTEMI, 50-60 %LAD    Past Medical History:  Diagnosis Date  . CAD (coronary artery disease)    EF = 0.40%,mild AR,RVSP=74mmHG  ECHO 08/26/13  . Coronary artery disease    consistent with angina/ NON STEMI (non ST elevated myocardial infaraction)  . Heart disease   . 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

## 2017-10-25 ENCOUNTER — Ambulatory Visit: Payer: Federal, State, Local not specified - PPO | Admitting: Cardiovascular Disease

## 2017-10-25 ENCOUNTER — Encounter: Payer: Self-pay | Admitting: Cardiovascular Disease

## 2017-10-25 VITALS — BP 152/82 | HR 67 | Ht 69.0 in | Wt 258.0 lb

## 2017-10-25 DIAGNOSIS — E785 Hyperlipidemia, unspecified: Secondary | ICD-10-CM

## 2017-10-25 DIAGNOSIS — I340 Nonrheumatic mitral (valve) insufficiency: Secondary | ICD-10-CM

## 2017-10-25 DIAGNOSIS — I2583 Coronary atherosclerosis due to lipid rich plaque: Secondary | ICD-10-CM

## 2017-10-25 DIAGNOSIS — I2510 Atherosclerotic heart disease of native coronary artery without angina pectoris: Secondary | ICD-10-CM

## 2017-10-25 NOTE — Patient Instructions (Addendum)
Medication Instructions:  Your physician recommends that you continue on your current medications as directed. Please refer to the Current Medication list given to you today.  Labwork: NONE  Testing/Procedures: Your physician has requested that you have en exercise stress myoview. For further information please visit [email or web address]. Please follow instruction sheet, as given.  Follow-Up: Your physician wants you to follow-up in: 6 months with Dr. Nishan. You will receive a reminder letter in the mail two months in advance. If you don't receive a letter, please call our office to schedule the follow-up appointment.   If you need a refill on your cardiac medications before your next appointment, please call your pharmacy.   

## 2017-10-26 DIAGNOSIS — J9601 Acute respiratory failure with hypoxia: Secondary | ICD-10-CM | POA: Diagnosis not present

## 2017-11-01 DIAGNOSIS — M4726 Other spondylosis with radiculopathy, lumbar region: Secondary | ICD-10-CM | POA: Diagnosis not present

## 2017-11-01 DIAGNOSIS — M545 Low back pain: Secondary | ICD-10-CM | POA: Diagnosis not present

## 2017-11-01 DIAGNOSIS — M5431 Sciatica, right side: Secondary | ICD-10-CM | POA: Diagnosis not present

## 2017-11-04 ENCOUNTER — Telehealth (HOSPITAL_COMMUNITY): Payer: Self-pay | Admitting: *Deleted

## 2017-11-04 NOTE — Telephone Encounter (Signed)
Patient given detailed instructions per Myocardial Perfusion Study Information Sheet for the test on 11/06/17. Patient notified to arrive 15 minutes early and that it is imperative to arrive on time for appointment to keep from having the test rescheduled.  If you need to cancel or reschedule your appointment, please call the office within 24 hours of your appointment. . Patient verbalized understanding. Manuelito Poage Jacqueline    

## 2017-11-06 ENCOUNTER — Encounter (HOSPITAL_COMMUNITY): Payer: Federal, State, Local not specified - PPO

## 2017-11-10 DIAGNOSIS — G4733 Obstructive sleep apnea (adult) (pediatric): Secondary | ICD-10-CM | POA: Diagnosis not present

## 2017-11-13 ENCOUNTER — Ambulatory Visit: Payer: Federal, State, Local not specified - PPO | Admitting: Acute Care

## 2017-11-19 DIAGNOSIS — M5116 Intervertebral disc disorders with radiculopathy, lumbar region: Secondary | ICD-10-CM | POA: Diagnosis not present

## 2017-11-19 DIAGNOSIS — M5117 Intervertebral disc disorders with radiculopathy, lumbosacral region: Secondary | ICD-10-CM | POA: Diagnosis not present

## 2017-11-19 DIAGNOSIS — M48061 Spinal stenosis, lumbar region without neurogenic claudication: Secondary | ICD-10-CM | POA: Diagnosis not present

## 2017-11-19 DIAGNOSIS — M4726 Other spondylosis with radiculopathy, lumbar region: Secondary | ICD-10-CM | POA: Diagnosis not present

## 2017-11-19 DIAGNOSIS — M5432 Sciatica, left side: Secondary | ICD-10-CM | POA: Diagnosis not present

## 2017-11-19 DIAGNOSIS — M5431 Sciatica, right side: Secondary | ICD-10-CM | POA: Diagnosis not present

## 2017-11-19 DIAGNOSIS — M5126 Other intervertebral disc displacement, lumbar region: Secondary | ICD-10-CM | POA: Diagnosis not present

## 2017-11-25 ENCOUNTER — Encounter: Payer: Self-pay | Admitting: Acute Care

## 2017-11-25 ENCOUNTER — Ambulatory Visit (INDEPENDENT_AMBULATORY_CARE_PROVIDER_SITE_OTHER)
Admission: RE | Admit: 2017-11-25 | Discharge: 2017-11-25 | Disposition: A | Discharge status: Home / Self Care | Payer: Federal, State, Local not specified - PPO | Source: Ambulatory Visit | Attending: Pulmonary Disease | Admitting: Pulmonary Disease

## 2017-11-25 ENCOUNTER — Ambulatory Visit: Payer: Federal, State, Local not specified - PPO | Admitting: Acute Care

## 2017-11-25 VITALS — BP 134/80 | HR 67 | Ht 69.0 in | Wt 266.0 lb

## 2017-11-25 DIAGNOSIS — J189 Pneumonia, unspecified organism: Secondary | ICD-10-CM

## 2017-11-25 DIAGNOSIS — J45909 Unspecified asthma, uncomplicated: Secondary | ICD-10-CM | POA: Insufficient documentation

## 2017-11-25 DIAGNOSIS — G4733 Obstructive sleep apnea (adult) (pediatric): Secondary | ICD-10-CM | POA: Diagnosis not present

## 2017-11-25 DIAGNOSIS — Z9989 Dependence on other enabling machines and devices: Secondary | ICD-10-CM

## 2017-11-25 DIAGNOSIS — R0600 Dyspnea, unspecified: Secondary | ICD-10-CM

## 2017-11-25 DIAGNOSIS — J9601 Acute respiratory failure with hypoxia: Secondary | ICD-10-CM | POA: Diagnosis not present

## 2017-11-25 NOTE — Progress Notes (Signed)
History of Present Illness Jared Morse is a 57 y.o. male with hypoxemia after severe  flu/ CAP  requiring hospitalization 09/2017, OSA on CPAP ( Managed by outside office), and asthma. He is followed by Dr. Kendrick Fries.   11/25/2017  Follow up  Pneumonia.  Pt. States he does feel better. He continues to have chest tightness on occasions with his allergies and asthma.He states this is usually when he has been exposed to pollen. He states he is compliant with his Flovent. He states he has been using his rescue inhaler once weekly and this is due to the pollen. He is compliant with his anti-histamine. He has not had to use his nebs for about 2 weeks. He states he has minimal secretions.He is concerned his oxygen sats have not returned to 99% where they were before his pneumonia. He states he has had weight gain , and he has not resumed his normal physical activity. He is still on desk duty at work. He states he has just occasional wheezing. Pt. Had both pneumococcal and flu vaccine while in the hospital in March 2019. He denies fever, chest pain, orthopnea or hemoptysis.  Test Results: 11/25/2017>> CXR   CBC Latest Ref Rng & Units 09/21/2017 09/20/2017 09/18/2017  WBC 4.0 - 10.5 K/uL 8.9 11.5(H) 13.7(H)  Hemoglobin 13.0 - 17.0 g/dL 95.3 12.5(L) 13.3  Hematocrit 39.0 - 52.0 % 39.5 37.6(L) 39.8  Platelets 150 - 400 K/uL 306 303 335    BMP Latest Ref Rng & Units 09/22/2017 09/21/2017 09/21/2017  Glucose 65 - 99 mg/dL 202(B) - 83  BUN 6 - 20 mg/dL 34(D) - 20  Creatinine 0.61 - 1.24 mg/dL 5.68 - 6.16  Sodium 837 - 145 mmol/L 138 - 135  Potassium 3.5 - 5.1 mmol/L 4.3 4.4 5.8(H)  Chloride 101 - 111 mmol/L 101 - 100(L)  CO2 22 - 32 mmol/L 27 - 24  Calcium 8.9 - 10.3 mg/dL 2.9(M) - 8.3(L)    BNP No results found for: BNP  ProBNP No results found for: PROBNP  PFT No results found for: FEV1PRE, FEV1POST, FVCPRE, FVCPOST, TLC, DLCOUNC, PREFEV1FVCRT, PSTFEV1FVCRT  No results found.   Past medical hx Past  Medical History:  Diagnosis Date  . CAD (coronary artery disease)    EF = 0.40%,mild AR,RVSP=69mmHG  ECHO 08/26/13  . Coronary artery disease    consistent with angina/ NON STEMI (non ST elevated myocardial infaraction)  . Heart disease   . Myocardial infarction [facility]) 07/2013     Social History   Tobacco Use  . Smoking status: Never Smoker  . Smokeless tobacco: Never Used  Substance Use Topics  . Alcohol use: No  . Drug use: No    Mr.Agustin reports that he has never smoked. He has never used smokeless tobacco. He reports that he does not drink alcohol or use drugs.  Tobacco Cessation: Never smoker  Past surgical hx, Family hx, Social hx all reviewed.  Current Outpatient Medications on File Prior to Visit  Medication Sig  . albuterol (PROVENTIL) (2.5 MG/3ML) 0.083% nebulizer solution Take 3 mLs by nebulization 3 (three) times daily as needed for wheezing.  Marland Kitchen albuterol (PROVENTIL) (2.5 MG/3ML) 0.083% nebulizer solution Take 3 mLs (2.5 mg total) by nebulization every 6 (six) hours as needed for wheezing or shortness of breath.  Marland Kitchen aspirin 81 MG tablet Take 81 mg by mouth daily.  Marland Kitchen atorvastatin (LIPITOR) 80 MG tablet Take 80 mg by mouth daily.  . carvedilol (COREG) 12.5 MG tablet Take 1  tablet (12.5 mg total) by mouth 2 (two) times daily with a meal.  . FLOVENT HFA 110 MCG/ACT inhaler Inhale 1 puff into the lungs 2 (two) times daily.  . fluticasone (FLONASE) 50 MCG/ACT nasal spray Place 2 sprays into both nostrils daily.  Marland Kitchen ipratropium-albuterol (DUONEB) 0.5-2.5 (3) MG/3ML SOLN Take 3 mLs by nebulization every 8 (eight) hours as needed for wheezing. X 15 days starting 2/19  . nitroGLYCERIN (NITROSTAT) 0.4 MG SL tablet Place 0.4 mg under the tongue every 5 (five) minutes as needed for chest pain.  Marland Kitchen PROAIR HFA 108 (90 Base) MCG/ACT inhaler Inhale 1 puff into the lungs every 6 (six) hours as needed for wheezing.   No current facility-administered medications on file prior to visit.       No Known Allergies  Review Of Systems:  Constitutional:   No  weight loss, night sweats,  Fevers, chills, fatigue, or  lassitude.  HEENT:   No headaches,  Difficulty swallowing,  Tooth/dental problems, or  Sore throat,                No sneezing, itching, ear ache,+ occasional  nasal congestion, + occasional post nasal drip,   CV:  No chest pain,  Occasional chest tightness after exposure to pollen, Orthopnea, PND, swelling in lower extremities, anasarca, dizziness, palpitations, syncope.   GI  No heartburn, indigestion, abdominal pain, nausea, vomiting, diarrhea, change in bowel habits, loss of appetite, bloody stools.   Resp: + shortness of breath with exertion not  at rest.  No excess mucus, no productive cough,  No non-productive cough,  No coughing up of blood.  No change in color of mucus.  No  wheezing.  No chest wall deformity  Skin: no rash or lesions.  GU: no dysuria, change in color of urine, no urgency or frequency.  No flank pain, no hematuria   MS:  No joint pain or swelling.  No decreased range of motion.  No back pain.  Psych:  No change in mood or affect. No depression or anxiety.  No memory loss.   Vital Signs BP 134/80 (BP Location: Left Arm, Cuff Size: Normal)   Pulse 67   Ht 5\' 9"  (1.753 m)   Wt 266 lb (120.7 kg)   SpO2 96%   BMI 39.28 kg/m    Physical Exam:  General- No distress,  A&Ox3, pleasant  ENT: No sinus tenderness, TM clear, pale nasal mucosa, no oral exudate,+ post nasal drip, no LAN Cardiac: S1, S2, regular rate and rhythm, no murmur Chest: No wheeze/ rales/ dullness; no accessory muscle use, no nasal flaring, no sternal retractions Abd.: Soft Non-tender, BS +, ND Ext: No clubbing cyanosis, edema Neuro:  normal strength, MAE x 4, A&O x 3 Skin: No rashes, warm and dry Psych: normal mood and behavior   Assessment/Plan  Multifocal pneumonia Feeling better Deconditioned from prolonged illness Plan: We will call you with the results  of your CXR. Start increasing your activity slowly, inside if at all possible. Using your treadmill is a good start. Follow up in 1 month if CXR is still not clear. If CXR is clear we can do a 2 month follow up. Please contact office for sooner follow up if symptoms do not improve or worsen or seek emergency care      Asthma Stable on Current regimen Plan: Continue your Flovent 1 puff twice daily for your asthma. Rinse mouth after use. Continue Flonase nasal Spart 2 sprays daily both nostrils. Start increasing  your activity slowly, inside if at all possible. Using your treadmill is a good start. Continue using your non-sedating antihistamine Allergy precautions: Follow up in 1-2 months Please contact office for sooner follow up if symptoms do not improve or worsen or seek emergency care    OSA on CPAP Managed at outside office Plan: Continue on CPAP at bedtime. You appear to be benefiting from the treatment Goal is to wear for at least 6 hours each night for maximal clinical benefit. Continue to work on weight loss, as the link between excess weight  and sleep apnea is well established.  Do not drive if sleepy. Remember to clean mask, tubing, filter, and reservoir once weekly with soapy water.  Follow up with Sleep MD from [facility] per their recommendation.     Bevelyn Ngo, NP 11/25/2017  1:39 PM

## 2017-11-25 NOTE — Assessment & Plan Note (Signed)
Stable on Current regimen Plan: Continue your Flovent 1 puff twice daily for your asthma. Rinse mouth after use. Continue Flonase nasal Spart 2 sprays daily both nostrils. Start increasing your activity slowly, inside if at all possible. Using your treadmill is a good start. Continue using your non-sedating antihistamine Allergy precautions: Follow up in 1-2 months Please contact office for sooner follow up if symptoms do not improve or worsen or seek emergency care

## 2017-11-25 NOTE — Patient Instructions (Addendum)
It is good to see you today. We will call you with the results of your CXR. Continue your Flovent 1 puff twice daily for your asthma. Rinse mouth after use. Continue Flonase nasal Spart 2 sprays daily both nostrils. Start increasing your activity slowly, inside if at all possible. Using your treadmill is a good start. Continue using your non-sedating antihistamine Allergy precautions: Follow up in 1 month if CXR is still not clear. If CXR is clear we can do a 2 month follow up. Continue on CPAP at bedtime. You appear to be benefiting from the treatment Goal is to wear for at least 6 hours each night for maximal clinical benefit. Continue to work on weight loss, as the link between excess weight  and sleep apnea is well established.  Do not drive if sleepy. Remember to clean mask, tubing, filter, and reservoir once weekly with soapy water.  Follow up with your sleep MD or transfer care to [facility].. Whatever works best for you. Please contact office for sooner follow up if symptoms do not improve or worsen or seek emergency care

## 2017-11-25 NOTE — Assessment & Plan Note (Signed)
Managed at outside office Plan: Continue on CPAP at bedtime. You appear to be benefiting from the treatment Goal is to wear for at least 6 hours each night for maximal clinical benefit. Continue to work on weight loss, as the link between excess weight  and sleep apnea is well established.  Do not drive if sleepy. Remember to clean mask, tubing, filter, and reservoir once weekly with soapy water.  Follow up with Sleep MD from [facility] office per their recommendation.

## 2017-11-25 NOTE — Assessment & Plan Note (Signed)
Feeling better Deconditioned from prolonged illness Plan: We will call you with the results of your CXR. Start increasing your activity slowly, inside if at all possible. Using your treadmill is a good start. Follow up in 1 month if CXR is still not clear. If CXR is clear we can do a 2 month follow up. Please contact office for sooner follow up if symptoms do not improve or worsen or seek emergency care

## 2017-12-04 DIAGNOSIS — E119 Type 2 diabetes mellitus without complications: Secondary | ICD-10-CM | POA: Diagnosis not present

## 2017-12-04 DIAGNOSIS — J181 Lobar pneumonia, unspecified organism: Secondary | ICD-10-CM | POA: Diagnosis not present

## 2017-12-04 DIAGNOSIS — R739 Hyperglycemia, unspecified: Secondary | ICD-10-CM | POA: Diagnosis not present

## 2017-12-04 DIAGNOSIS — Z6837 Body mass index (BMI) 37.0-37.9, adult: Secondary | ICD-10-CM | POA: Diagnosis not present

## 2017-12-10 DIAGNOSIS — G4733 Obstructive sleep apnea (adult) (pediatric): Secondary | ICD-10-CM | POA: Diagnosis not present

## 2017-12-13 DIAGNOSIS — Z6836 Body mass index (BMI) 36.0-36.9, adult: Secondary | ICD-10-CM | POA: Diagnosis not present

## 2017-12-13 DIAGNOSIS — M5126 Other intervertebral disc displacement, lumbar region: Secondary | ICD-10-CM | POA: Diagnosis not present

## 2017-12-26 DIAGNOSIS — J9601 Acute respiratory failure with hypoxia: Secondary | ICD-10-CM | POA: Diagnosis not present

## 2018-01-10 DIAGNOSIS — G4733 Obstructive sleep apnea (adult) (pediatric): Secondary | ICD-10-CM | POA: Diagnosis not present

## 2018-01-13 DIAGNOSIS — E119 Type 2 diabetes mellitus without complications: Secondary | ICD-10-CM | POA: Diagnosis not present

## 2018-01-13 DIAGNOSIS — T148XXA Other injury of unspecified body region, initial encounter: Secondary | ICD-10-CM | POA: Diagnosis not present

## 2018-01-13 DIAGNOSIS — M542 Cervicalgia: Secondary | ICD-10-CM | POA: Diagnosis not present

## 2018-01-15 DIAGNOSIS — G4733 Obstructive sleep apnea (adult) (pediatric): Secondary | ICD-10-CM | POA: Diagnosis not present

## 2018-01-15 DIAGNOSIS — J9601 Acute respiratory failure with hypoxia: Secondary | ICD-10-CM | POA: Diagnosis not present

## 2018-01-20 DIAGNOSIS — M62838 Other muscle spasm: Secondary | ICD-10-CM | POA: Diagnosis not present

## 2018-01-20 DIAGNOSIS — M542 Cervicalgia: Secondary | ICD-10-CM | POA: Diagnosis not present

## 2018-02-09 DIAGNOSIS — G4733 Obstructive sleep apnea (adult) (pediatric): Secondary | ICD-10-CM | POA: Diagnosis not present

## 2018-04-17 DIAGNOSIS — H18613 Keratoconus, stable, bilateral: Secondary | ICD-10-CM | POA: Diagnosis not present

## 2018-05-27 DIAGNOSIS — E785 Hyperlipidemia, unspecified: Secondary | ICD-10-CM | POA: Diagnosis not present

## 2018-05-27 DIAGNOSIS — Z0000 Encounter for general adult medical examination without abnormal findings: Secondary | ICD-10-CM | POA: Diagnosis not present

## 2018-05-27 DIAGNOSIS — G4733 Obstructive sleep apnea (adult) (pediatric): Secondary | ICD-10-CM | POA: Diagnosis not present

## 2018-05-27 DIAGNOSIS — Z125 Encounter for screening for malignant neoplasm of prostate: Secondary | ICD-10-CM | POA: Diagnosis not present

## 2018-05-27 DIAGNOSIS — I2510 Atherosclerotic heart disease of native coronary artery without angina pectoris: Secondary | ICD-10-CM | POA: Diagnosis not present

## 2018-05-27 DIAGNOSIS — E119 Type 2 diabetes mellitus without complications: Secondary | ICD-10-CM | POA: Diagnosis not present

## 2018-06-01 IMAGING — DX DG CHEST 2V
2 series · 2 of 2 positions shown · non-contrast
Comparison: 09/20/2017

CLINICAL DATA: F/u on PNA, some congestion.ob remains, pt still hv
to use oxygen at night intermittent, HTN, hx of asthma, MI. CAD,
stents.

EXAM:
CHEST - 2 VIEW

[chest pa]
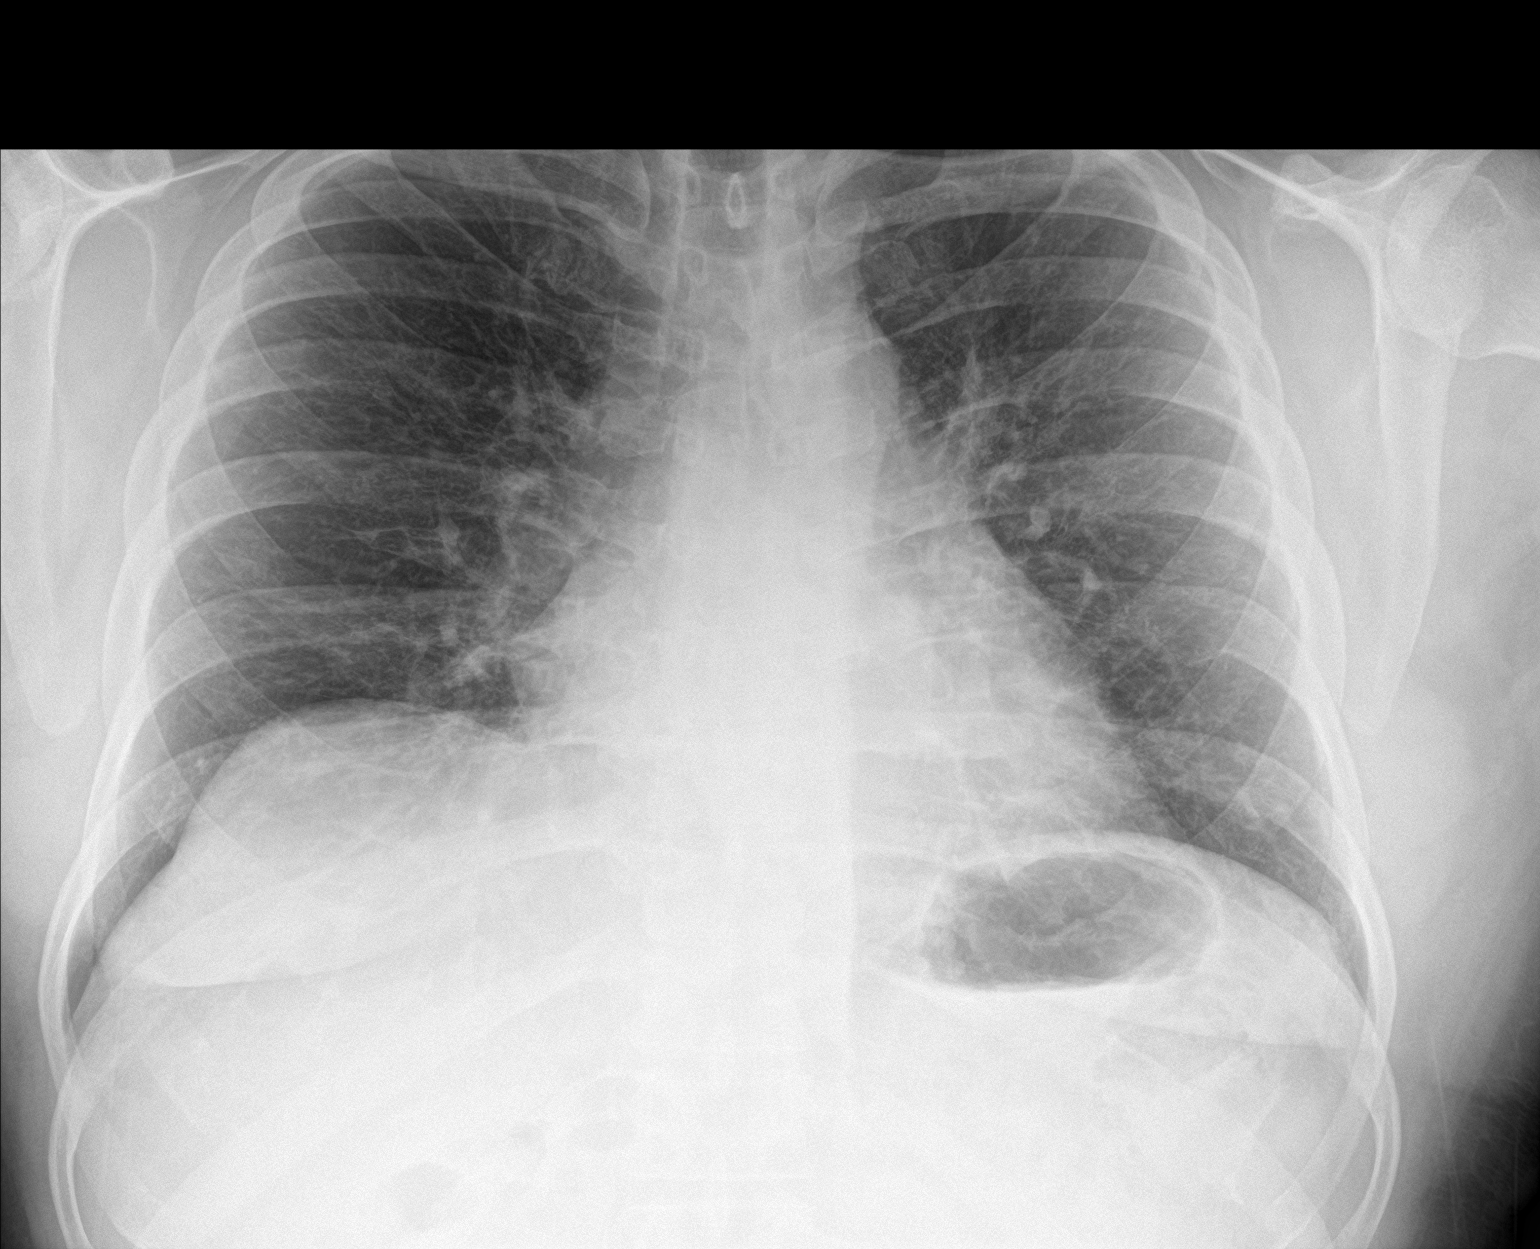

[chest lat]
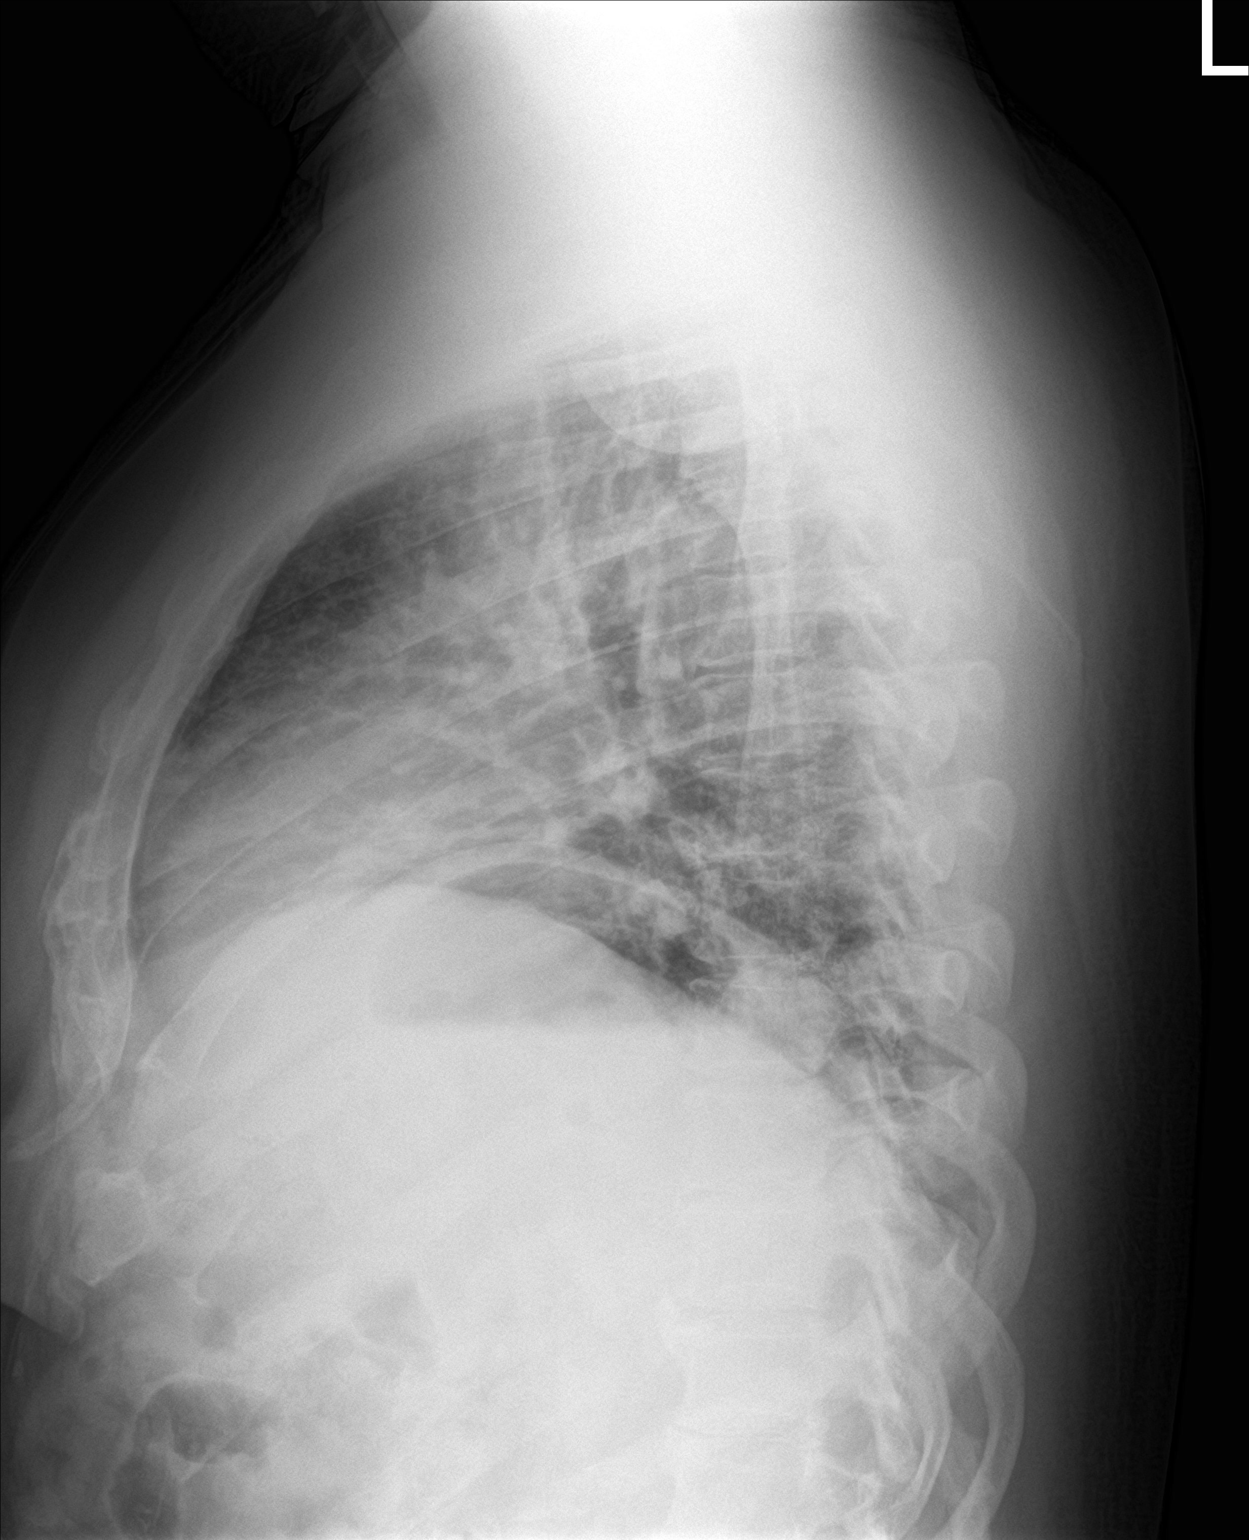

[2 of 2 positions shown; findings below may reference images not displayed]

FINDINGS: Cardiac silhouette is normal in size. No mediastinal or hilar
masses. No convincing adenopathy.

Lung volumes are relatively low. There is crowding of the
bronchovascular structures similar to the prior study. Patchy
airspace opacities noted on the prior exams have almost completely
resolved with a few interstitial type opacities persisting in the
lower lungs. No new lung abnormalities.

No pleural effusion or pneumothorax.

Skeletal structures are grossly intact.
IMPRESSION: 1. Significant continued improvement. Most of the previously seen
patchy airspace opacities have resolved. There are mild residual
interstitial type opacities at lung bases consistent with either
residual pneumonia, atelectasis or a combination.

## 2018-06-03 ENCOUNTER — Encounter: Payer: Self-pay | Admitting: Family Medicine

## 2018-06-03 ENCOUNTER — Telehealth: Payer: Self-pay | Admitting: Pulmonary Disease

## 2018-06-03 ENCOUNTER — Ambulatory Visit: Payer: Federal, State, Local not specified - PPO | Admitting: Family Medicine

## 2018-06-03 VITALS — BP 112/78 | HR 67 | Temp 98.6°F | Resp 12 | Ht 69.0 in | Wt 271.4 lb

## 2018-06-03 DIAGNOSIS — Z1211 Encounter for screening for malignant neoplasm of colon: Secondary | ICD-10-CM

## 2018-06-03 DIAGNOSIS — L219 Seborrheic dermatitis, unspecified: Secondary | ICD-10-CM | POA: Diagnosis not present

## 2018-06-03 DIAGNOSIS — R7303 Prediabetes: Secondary | ICD-10-CM | POA: Diagnosis not present

## 2018-06-03 DIAGNOSIS — I10 Essential (primary) hypertension: Secondary | ICD-10-CM

## 2018-06-03 DIAGNOSIS — H6092 Unspecified otitis externa, left ear: Secondary | ICD-10-CM

## 2018-06-03 MED ORDER — DEXAMETHASONE 0.1 % OP SUSP: OPHTHALMIC | 0 refills | Status: DC

## 2018-06-03 MED ORDER — NEOMYCIN-POLYMYXIN-HC 3.5-10000-1 OT SOLN: 3.0000 [drp] | Freq: Three times a day (TID) | OTIC | 0 refills | Status: AC

## 2018-06-03 NOTE — Telephone Encounter (Signed)
Called and spoke with patient. He states they are going out of town for Thanksgiving.   Cancelled appt for 06/18/2018 and rescheduled for August 06 2018.   I am going to forward this to Merrilee Seashore in case Dr. Kendrick Fries wants to see patient sooner in a held spot.

## 2018-06-03 NOTE — Progress Notes (Signed)
HPI:   [name] is a 57 y.o. male, who is here today to establish care.  Former PCP: Jared Morse Dr. Conley Morse at [facility].  Last preventive routine visit: 05/2018.  I saw Ms Jared Morse 3-4 years ago at [facility].  Chronic medical problems: Asthma, CAD s/p PCI/stent, OSA, abnormal liver function test, and chronic back pain among some.  IFG, he has not been consistent with a healthful diet and he is not exercising. Last HgA1C 6.0 on 05/27/18.  HTN: Currently he is on  Denies severe/frequent headache, visual changes, chest pain, dyspnea, palpitation, claudication, focal weakness, or edema.  HLD: Last FLP 06/2017. TC 94,TG 146,HDL 26,LDL 26. He is on Atorvastatin 80 mg daily.  He follows with pulmonologist, Dr. Kendrick Morse for asthma and cardiologist, Dr. Eden Morse  Concerns today: Bilateral ear canal pruritus for a while now, for the past few days he has had left ear ache.  Dx with seborrhea dermatitis, he has used topical steroid before and it helped.  Negative for fever, chills,sore throat,cough,wheezing,or cough.   Review of Systems  Constitutional: Negative for activity change, appetite change, fatigue and fever.  HENT: Positive for ear pain. Negative for ear discharge, hearing loss, nosebleeds, sore throat and trouble swallowing.   Eyes: Negative for redness and visual disturbance.  Respiratory: Negative for apnea, cough, shortness of breath and wheezing.   Cardiovascular: Negative for chest pain, palpitations and leg swelling.  Gastrointestinal: Negative for abdominal pain, nausea and vomiting.  Endocrine: Negative for polydipsia, polyphagia and polyuria.  Genitourinary: Negative for decreased urine volume, dysuria and hematuria.  Musculoskeletal: Negative for gait problem and myalgias.  Skin: Negative for rash.  Allergic/Immunologic: Positive for environmental allergies.  Neurological: Negative for weakness and headaches.  Psychiatric/Behavioral: Negative for  confusion and sleep disturbance.      Current Outpatient Medications on File Prior to Visit  Medication Sig Dispense Refill  . albuterol (PROVENTIL) (2.5 MG/3ML) 0.083% nebulizer solution Take 3 mLs (2.5 mg total) by nebulization every 6 (six) hours as needed for wheezing or shortness of breath. 75 mL 0  . aspirin 81 MG tablet Take 81 mg by mouth daily.    Marland Kitchen atorvastatin (LIPITOR) 80 MG tablet Take 80 mg by mouth daily.    . carvedilol (COREG) 12.5 MG tablet Take 1 tablet (12.5 mg total) by mouth 2 (two) times daily with a meal. 180 tablet 3  . FLOVENT HFA 110 MCG/ACT inhaler Inhale 1 puff into the lungs 2 (two) times daily.  0  . fluticasone (FLONASE) 50 MCG/ACT nasal spray Place 2 sprays into both nostrils daily.  0  . gabapentin (NEURONTIN) 300 MG capsule Take 300 mg by mouth at bedtime.  4  . ipratropium-albuterol (DUONEB) 0.5-2.5 (3) MG/3ML SOLN Take 3 mLs by nebulization every 8 (eight) hours as needed for wheezing. X 15 days starting 2/19  0  . nitroGLYCERIN (NITROSTAT) 0.4 MG SL tablet Place 0.4 mg under the tongue every 5 (five) minutes as needed for chest pain.  12  . PROAIR HFA 108 (90 Base) MCG/ACT inhaler Inhale 1 puff into the lungs every 6 (six) hours as needed for wheezing.  3  . tadalafil (ADCIRCA/CIALIS) 20 MG tablet Take 1 tablet 30 minutes prior to sexual activity. Do not exceed 1 tablet in 72 hours  5   No current facility-administered medications on file prior to visit.      Past Medical History:  Diagnosis Date  . Allergy   . Asthma   .  CAD (coronary artery disease)    EF = 0.40%,mild AR,RVSP=14mmHG  ECHO 08/26/13  . Coronary artery disease    consistent with angina/ NON STEMI (non ST elevated myocardial infaraction)  . Heart disease   . History of chicken pox   . Hypertension   . Myocardial infarction (HCC) 07/2013   No Known Allergies  Family History  Problem Relation Age of Onset  . Hypertension Mother   . Diabetes Mother   . Heart disease Mother     . Hyperlipidemia Mother   . COPD Father   . COPD Sister   . Stroke Sister     Social History   Socioeconomic History  . Marital status: Married    Spouse name: Not on file  . Number of children: Not on file  . Years of education: Not on file  . Highest education level: Not on file  Occupational History  . Not on file  Social Needs  . Financial resource strain: Not on file  . Food insecurity:    Worry: Not on file    Inability: Not on file  . Transportation needs:    Medical: Not on file    Non-medical: Not on file  Tobacco Use  . Smoking status: Never Smoker  . Smokeless tobacco: Never Used  Substance and Sexual Activity  . Alcohol use: No  . Drug use: No  . Sexual activity: Yes    Partners: Female  Lifestyle  . Physical activity:    Days per week: Not on file    Minutes per session: Not on file  . Stress: Not on file  Relationships  . Social connections:    Talks on phone: Not on file    Gets together: Not on file    Attends religious service: Not on file    Active member of club or organization: Not on file    Attends meetings of clubs or organizations: Not on file    Relationship status: Not on file  Other Topics Concern  . Not on file  Social History Narrative  . Not on file    Vitals:   06/03/18 0848  BP: 112/78  Pulse: 67  Resp: 12  Temp: 98.6 F (37 C)  SpO2: 95%    Body mass index is 40.08 kg/m.   Physical Exam  Nursing note and vitals reviewed. Constitutional: He is oriented to person, place, and time. He appears well-developed. No distress.  HENT:  Head: Normocephalic and atraumatic.  Right Ear: Hearing, tympanic membrane and external ear normal.  Left Ear: Hearing and tympanic membrane normal. There is tenderness.  Mouth/Throat: Oropharynx is clear and moist and mucous membranes are normal.  No drainage, minimal erythema.  Eyes: Pupils are equal, round, and reactive to light. Conjunctivae and EOM are normal.  Cardiovascular:  Normal rate and regular rhythm.  No murmur heard. Pulses:      Dorsalis pedis pulses are 2+ on the right side, and 2+ on the left side.  Respiratory: Effort normal and breath sounds normal. No respiratory distress.  GI: Soft. He exhibits no mass. There is no hepatomegaly. There is no tenderness.  Musculoskeletal: He exhibits no edema.  Lymphadenopathy:    He has no cervical adenopathy.  Neurological: He is alert and oriented to person, place, and time. He has normal strength. No cranial nerve deficit. Gait normal.  Skin: Skin is warm. No rash noted. No erythema.  Psychiatric: He has a normal mood and affect.  Well groomed, good eye contact.  ASSESSMENT AND PLAN:   Mr. MADDUX was seen today for establish care.  Diagnoses and all orders for this visit:  Otitis externa of left ear, unspecified chronicity, unspecified type Topical abx for 7 days. Avoid scratching ear canal or using Qtips. F/U as needed.  -     neomycin-polymyxin-hydrocortisone (CORTISPORIN) OTIC solution; Place 3 drops into both ears 3 (three) times daily for 7 days.  Prediabetes Healthy lifestyle for primary prevention recommended. We will follow in 6 months.  Obesity, morbid, BMI 40.0-49.9 (HCC) We discussed benefits of wt loss as well as adverse effects of obesity. Consistency with healthy diet and physical activity recommended.   Benign essential hypertension Adequately controlled. No changes in current management. DASH diet recommended. Eye exam recommended annually. F/U in 6 months, before if needed.   Seborrheic dermatitis, unspecified Recommend topical steroid, dexamethasone, 1 drop daily as needed for ear pruritus. Avoid Q-tips. Follow-up as needed.   Colon cancer screening -     Ambulatory referral to Gastroenterology      Betty G. [place], MD  [facility]. [place] office.

## 2018-06-03 NOTE — Assessment & Plan Note (Signed)
Recommend topical steroid, dexamethasone, 1 drop daily as needed for ear pruritus. Avoid Q-tips. Follow-up as needed.

## 2018-06-03 NOTE — Patient Instructions (Addendum)
A few things to remember from today's visit:   Colon cancer screening - Plan: Ambulatory referral to Gastroenterology  Prediabetes  Benign essential hypertension  DASH Eating Plan DASH stands for "Dietary Approaches to Stop Hypertension." The DASH eating plan is a healthy eating plan that has been shown to reduce high blood pressure (hypertension). It may also reduce your risk for type 2 diabetes, heart disease, and stroke. The DASH eating plan may also help with weight loss. What are tips for following this plan? General guidelines  Avoid eating more than 2,300 mg (milligrams) of salt (sodium) a day. If you have hypertension, you may need to reduce your sodium intake to 1,500 mg a day.  Limit alcohol intake to no more than 1 drink a day for nonpregnant women and 2 drinks a day for men. One drink equals 12 oz of beer, 5 oz of wine, or 1 oz of hard liquor.  Work with your health care provider to maintain a healthy body weight or to lose weight. Ask what an ideal weight is for you.  Get at least 30 minutes of exercise that causes your heart to beat faster (aerobic exercise) most days of the week. Activities may include walking, swimming, or biking.  Work with your health care provider or diet and nutrition specialist (dietitian) to adjust your eating plan to your individual calorie needs. Reading food labels  Check food labels for the amount of sodium per serving. Choose foods with less than 5 percent of the Daily Value of sodium. Generally, foods with less than 300 mg of sodium per serving fit into this eating plan.  To find whole grains, look for the word "whole" as the first word in the ingredient list. Shopping  Buy products labeled as "low-sodium" or "no salt added."  Buy fresh foods. Avoid canned foods and premade or frozen meals. Cooking  Avoid adding salt when cooking. Use salt-free seasonings or herbs instead of table salt or sea salt. Check with your health care provider  or pharmacist before using salt substitutes.  Do not fry foods. Cook foods using healthy methods such as baking, boiling, grilling, and broiling instead.  Cook with heart-healthy oils, such as olive, canola, soybean, or sunflower oil. Meal planning   Eat a balanced diet that includes: ? 5 or more servings of fruits and vegetables each day. At each meal, try to fill half of your plate with fruits and vegetables. ? Up to 6-8 servings of whole grains each day. ? Less than 6 oz of lean meat, poultry, or fish each day. A 3-oz serving of meat is about the same size as a deck of cards. One egg equals 1 oz. ? 2 servings of low-fat dairy each day. ? A serving of nuts, seeds, or beans 5 times each week. ? Heart-healthy fats. Healthy fats called Omega-3 fatty acids are found in foods such as flaxseeds and coldwater fish, like sardines, salmon, and mackerel.  Limit how much you eat of the following: ? Canned or prepackaged foods. ? Food that is high in trans fat, such as fried foods. ? Food that is high in saturated fat, such as fatty meat. ? Sweets, desserts, sugary drinks, and other foods with added sugar. ? Full-fat dairy products.  Do not salt foods before eating.  Try to eat at least 2 vegetarian meals each week.  Eat more home-cooked food and less restaurant, buffet, and fast food.  When eating at a restaurant, ask that your food be prepared with  less salt or no salt, if possible. What foods are recommended? The items listed may not be a complete list. Talk with your dietitian about what dietary choices are best for you. Grains Whole-grain or whole-wheat bread. Whole-grain or whole-wheat pasta. Brown rice. [place]. Bulgur. Whole-grain and low-sodium cereals. Pita bread. Low-fat, low-sodium crackers. Whole-wheat flour tortillas. Vegetables Fresh or frozen vegetables (raw, steamed, roasted, or grilled). Low-sodium or reduced-sodium tomato and vegetable juice. Low-sodium or  reduced-sodium tomato sauce and tomato paste. Low-sodium or reduced-sodium canned vegetables. Fruits All fresh, dried, or frozen fruit. Canned fruit in natural juice (without added sugar). Meat and other protein foods Skinless chicken or [place]. Ground chicken or [place]. Pork with fat trimmed off. Fish and seafood. Egg whites. Dried beans, peas, or lentils. Unsalted nuts, nut butters, and seeds. Unsalted canned beans. Lean cuts of beef with fat trimmed off. Low-sodium, lean deli meat. Dairy Low-fat (1%) or fat-free (skim) milk. Fat-free, low-fat, or reduced-fat cheeses. Nonfat, low-sodium ricotta or cottage cheese. Low-fat or nonfat yogurt. Low-fat, low-sodium cheese. Fats and oils Soft margarine without trans fats. Vegetable oil. Low-fat, reduced-fat, or light mayonnaise and salad dressings (reduced-sodium). Canola, safflower, olive, soybean, and sunflower oils. Avocado. Seasoning and other foods Herbs. Spices. Seasoning mixes without salt. Unsalted popcorn and pretzels. Fat-free sweets. What foods are not recommended? The items listed may not be a complete list. Talk with your dietitian about what dietary choices are best for you. Grains Baked goods made with fat, such as croissants, muffins, or some breads. Dry pasta or rice meal packs. Vegetables Creamed or fried vegetables. Vegetables in a cheese sauce. Regular canned vegetables (not low-sodium or reduced-sodium). Regular canned tomato sauce and paste (not low-sodium or reduced-sodium). Regular tomato and vegetable juice (not low-sodium or reduced-sodium). Angie Fava. Olives. Fruits Canned fruit in a light or heavy syrup. Fried fruit. Fruit in cream or butter sauce. Meat and other protein foods Fatty cuts of meat. Ribs. Fried meat. Berniece Salines. Sausage. Bologna and other processed lunch meats. Salami. Fatback. Hotdogs. Bratwurst. Salted nuts and seeds. Canned beans with added salt. Canned or smoked fish. Whole eggs or egg yolks. Chicken or [place]  with skin. Dairy Whole or 2% milk, cream, and half-and-half. Whole or full-fat cream cheese. Whole-fat or sweetened yogurt. Full-fat cheese. Nondairy creamers. Whipped toppings. Processed cheese and cheese spreads. Fats and oils Butter. Stick margarine. Lard. Shortening. Ghee. Bacon fat. Tropical oils, such as coconut, palm kernel, or palm oil. Seasoning and other foods Salted popcorn and pretzels. Onion salt, garlic salt, seasoned salt, table salt, and sea salt. Worcestershire sauce. Tartar sauce. Barbecue sauce. Teriyaki sauce. Soy sauce, including reduced-sodium. Steak sauce. Canned and packaged gravies. Fish sauce. Oyster sauce. Cocktail sauce. Horseradish that you find on the shelf. Ketchup. Mustard. Meat flavorings and tenderizers. Bouillon cubes. Hot sauce and Tabasco sauce. Premade or packaged marinades. Premade or packaged taco seasonings. Relishes. Regular salad dressings. Where to find more information:  National Heart, Lung, and [place]: https://wilson-eaton.com/  American Heart Association: www.heart.org Summary  The DASH eating plan is a healthy eating plan that has been shown to reduce high blood pressure (hypertension). It may also reduce your risk for type 2 diabetes, heart disease, and stroke.  With the DASH eating plan, you should limit salt (sodium) intake to 2,300 mg a day. If you have hypertension, you may need to reduce your sodium intake to 1,500 mg a day.  When on the DASH eating plan, aim to eat more fresh fruits and vegetables, whole grains, lean proteins,  low-fat dairy, and heart-healthy fats.  Work with your health care provider or diet and nutrition specialist (dietitian) to adjust your eating plan to your individual calorie needs. This information is not intended to replace advice given to you by your health care provider. Make sure you discuss any questions you have with your health care provider. Document Released: 06/28/2011 Document Revised: 07/02/2016  Document Reviewed: 07/02/2016 Elsevier Interactive Patient Education  [employer].   Please be sure medication list is accurate. If a new problem present, please set up appointment sooner than planned today.

## 2018-06-03 NOTE — Assessment & Plan Note (Signed)
We discussed benefits of wt loss as well as adverse effects of obesity. Consistency with healthy diet and physical activity recommended.  

## 2018-06-03 NOTE — Assessment & Plan Note (Signed)
Adequately controlled. No changes in current management. DASH diet recommended. Eye exam recommended annually. F/U in 6 months, before if needed.  

## 2018-06-03 NOTE — Assessment & Plan Note (Signed)
Healthy lifestyle for primary prevention recommended. We will follow in 6 months.

## 2018-06-04 ENCOUNTER — Ambulatory Visit: Payer: Federal, State, Local not specified - PPO | Admitting: Pulmonary Disease

## 2018-06-09 NOTE — Telephone Encounter (Signed)
Dr. Kendrick Fries,    Patient cancelled appointment for 06/18/18 and rescheduled for 08/06/18.  Reviewed his records and last OV was 11/25/17 without any issues since. Please let me know if you would like to get him in sooner or if January is fine.   Thanks,  Merrilee Seashore

## 2018-06-10 NOTE — Telephone Encounter (Signed)
January is fine

## 2018-06-10 NOTE — Telephone Encounter (Signed)
Noted.  Will close encounter.  

## 2018-06-18 ENCOUNTER — Ambulatory Visit: Payer: Federal, State, Local not specified - PPO | Admitting: Pulmonary Disease

## 2018-06-25 ENCOUNTER — Ambulatory Visit: Payer: Federal, State, Local not specified - PPO | Admitting: Pulmonary Disease

## 2018-07-09 ENCOUNTER — Encounter (INDEPENDENT_AMBULATORY_CARE_PROVIDER_SITE_OTHER): Payer: Self-pay

## 2018-08-06 ENCOUNTER — Encounter: Payer: Self-pay | Admitting: Family Medicine

## 2018-08-06 ENCOUNTER — Ambulatory Visit: Payer: Federal, State, Local not specified - PPO | Admitting: Pulmonary Disease

## 2018-08-06 ENCOUNTER — Encounter: Payer: Self-pay | Admitting: Pulmonary Disease

## 2018-08-06 ENCOUNTER — Ambulatory Visit (INDEPENDENT_AMBULATORY_CARE_PROVIDER_SITE_OTHER)
Admission: RE | Admit: 2018-08-06 | Discharge: 2018-08-06 | Disposition: A | Discharge status: Home / Self Care | Payer: Federal, State, Local not specified - PPO | Source: Ambulatory Visit | Attending: Pulmonary Disease | Admitting: Pulmonary Disease

## 2018-08-06 VITALS — BP 148/82 | HR 74 | Ht 69.0 in | Wt 274.0 lb

## 2018-08-06 DIAGNOSIS — J189 Pneumonia, unspecified organism: Secondary | ICD-10-CM | POA: Diagnosis not present

## 2018-08-06 DIAGNOSIS — Z23 Encounter for immunization: Secondary | ICD-10-CM

## 2018-08-06 DIAGNOSIS — J4540 Moderate persistent asthma, uncomplicated: Secondary | ICD-10-CM | POA: Diagnosis not present

## 2018-08-06 DIAGNOSIS — G4733 Obstructive sleep apnea (adult) (pediatric): Secondary | ICD-10-CM | POA: Diagnosis not present

## 2018-08-06 DIAGNOSIS — Z8701 Personal history of pneumonia (recurrent): Secondary | ICD-10-CM | POA: Diagnosis not present

## 2018-08-06 DIAGNOSIS — Z6841 Body Mass Index (BMI) 40.0 and over, adult: Secondary | ICD-10-CM

## 2018-08-06 MED ORDER — FLUTICASONE PROPIONATE HFA 110 MCG/ACT IN AERO: 1.0000 | INHALATION_SPRAY | Freq: Two times a day (BID) | RESPIRATORY_TRACT | 12 refills | Status: DC

## 2018-08-06 MED ORDER — PROAIR HFA 108 (90 BASE) MCG/ACT IN AERS: 1.0000 | INHALATION_SPRAY | Freq: Four times a day (QID) | RESPIRATORY_TRACT | 5 refills | Status: DC | PRN

## 2018-08-06 NOTE — Progress Notes (Signed)
Subjective:   PATIENT ID: Jared Morse GENDER: male DOB: 11/10/1960, MRN: [id number]  Synopsis: Referred in 09/2017 for hypoxemia after a hospitalization for the flu in early 2019   HPI  Chief Complaint  Patient presents with  . Follow-up    pt has been exercising more frequently, has noticed that his SOB has less severe.      Jared Morse has been doing fairly well.  He started running recently to help control his weight and he says it is going okay.  He is starting to increase his distance every week.  He says that he does not need to use his albuterol very frequently.  His Flovent use has been sporadic.  He says whenever he gets a cold he will have worsening asthma symptoms and have to use albuterol but that does not happen too often.  Past Medical History:  Diagnosis Date  . Allergy   . Asthma   . CAD (coronary artery disease)    EF = 0.40%,mild AR,RVSP=18mmHG  ECHO 08/26/13  . Coronary artery disease    consistent with angina/ NON STEMI (non ST elevated myocardial infaraction)  . Heart disease   . History of chicken pox   . Hypertension   . Myocardial infarction (HCC) 07/2013     Review of Systems  Constitutional: Negative for chills, fever, malaise/fatigue and weight loss.  HENT: Negative for congestion, nosebleeds, sinus pain and sore throat.   Eyes: Negative for photophobia, pain and discharge.  Respiratory: Positive for cough and shortness of breath. Negative for hemoptysis, sputum production and wheezing.   Cardiovascular: Negative for chest pain, palpitations, orthopnea and leg swelling.  Gastrointestinal: Negative for abdominal pain, constipation, diarrhea, nausea and vomiting.  Genitourinary: Negative for dysuria, frequency, hematuria and urgency.  Musculoskeletal: Negative for back pain, joint pain, myalgias and neck pain.  Skin: Negative for itching and rash.  Neurological: Negative for tingling, tremors, sensory change, speech change, focal weakness, seizures,  weakness and headaches.  Psychiatric/Behavioral: Negative for memory loss, substance abuse and suicidal ideas. The patient is not nervous/anxious.       Objective:  Physical Exam   Vitals:   08/06/18 1527  BP: (!) 148/82  Pulse: 74  SpO2: 96%  Weight: 274 lb (124.3 kg)  Height: 5\' 9"  (1.753 m)    Gen: obese but well appearing HENT: OP clear, TM's clear, neck supple PULM: CTA B, normal percussion CV: RRR, no mgr, trace edema GI: BS+, soft, nontender Derm: no cyanosis or rash Psyche: normal mood and affect    CBC    Component Value Date/Time   WBC 8.9 09/21/2017 0836   RBC 4.53 09/21/2017 0836   HGB 13.1 09/21/2017 0836   HCT 39.5 09/21/2017 0836   PLT 306 09/21/2017 0836   MCV 87.2 09/21/2017 0836   MCH 28.9 09/21/2017 0836   MCHC 33.2 09/21/2017 0836   RDW 15.2 09/21/2017 0836   LYMPHSABS 1.5 09/20/2017 0253   MONOABS 0.8 09/20/2017 0253   EOSABS 0.0 09/20/2017 0253   BASOSABS 0.0 09/20/2017 0253     Chest imaging: CT chest February 2019 showed diffuse multifocal infiltrates consistent with severe community-acquired pneumonia May 2019 chest x-ray images independently reviewed showed low lung volumes, some residual infiltrate but great improvement compared to the March study   PFT:  Labs:  Path:  Echo:  Heart Catheterization:       Assessment & Plan:   Multifocal pneumonia - Plan: DG Chest 2 View  OSA (obstructive sleep apnea)  Moderate persistent asthma without complication  Class 3 severe obesity without serious comorbidity with body mass index (BMI) of 40.0 to 44.9 in adult, unspecified obesity type (HCC)  Discussion: Jared Morse has recovered fairly well from his episode of severe community-acquired pneumonia last year.  We do need to get 1 more chest x-ray because the May 2019 study showed some residual infiltrates.  His asthma is well controlled.  His biggest focus needs to be on exercise and weight loss right now.  Plan: Mild persistent  asthma: Use Flovent 1 puff twice a day no matter how you feel Use albuterol as needed for chest tightness wheezing or shortness of breath Flu shot today Practice good hand hygiene Stay physically active  History of multifocal pneumonia: Repeat chest x-ray now  The following behaviors have been associated with weight loss: Weigh yourself daily Write down everything you eat Drink a glass of water prior to eating a meal Only eat when you are hungry Buy food from the periphery of the grocery store, not the middle  Follow up in one year or sooner if needed   Current Outpatient Medications:  .  albuterol (PROVENTIL) (2.5 MG/3ML) 0.083% nebulizer solution, Take 3 mLs (2.5 mg total) by nebulization every 6 (six) hours as needed for wheezing or shortness of breath., Disp: 75 mL, Rfl: 0 .  aspirin 81 MG tablet, Take 81 mg by mouth daily., Disp: , Rfl:  .  atorvastatin (LIPITOR) 80 MG tablet, Take 80 mg by mouth daily., Disp: , Rfl:  .  carvedilol (COREG) 12.5 MG tablet, Take 1 tablet (12.5 mg total) by mouth 2 (two) times daily with a meal., Disp: 180 tablet, Rfl: 3 .  dexamethasone (DECADRON) 0.1 % ophthalmic suspension, To apply in ear canal daily as needed for itching., Disp: 15 mL, Rfl: 0 .  fluticasone (FLONASE) 50 MCG/ACT nasal spray, Place 2 sprays into both nostrils daily as needed. , Disp: , Rfl: 0 .  gabapentin (NEURONTIN) 300 MG capsule, Take 300 mg by mouth at bedtime., Disp: , Rfl: 4 .  ipratropium-albuterol (DUONEB) 0.5-2.5 (3) MG/3ML SOLN, Take 3 mLs by nebulization every 8 (eight) hours as needed for wheezing. X 15 days starting 2/19, Disp: , Rfl: 0 .  nitroGLYCERIN (NITROSTAT) 0.4 MG SL tablet, Place 0.4 mg under the tongue every 5 (five) minutes as needed for chest pain., Disp: , Rfl: 12 .  tadalafil (ADCIRCA/CIALIS) 20 MG tablet, Take 1 tablet 30 minutes prior to sexual activity. Do not exceed 1 tablet in 72 hours, Disp: , Rfl: 5 .  fluticasone (FLOVENT HFA) 110 MCG/ACT  inhaler, Inhale 1 puff into the lungs 2 (two) times daily., Disp: 1 Inhaler, Rfl: 12 .  PROAIR HFA 108 (90 Base) MCG/ACT inhaler, Inhale 1 puff into the lungs every 6 (six) hours as needed for wheezing., Disp: 1 Inhaler, Rfl: 5

## 2018-08-06 NOTE — Patient Instructions (Signed)
Mild persistent asthma: Use Flovent 1 puff twice a day no matter how you feel Use albuterol as needed for chest tightness wheezing or shortness of breath Flu shot today Practice good hand hygiene Stay physically active  History of multifocal pneumonia: Repeat chest x-ray now  The following behaviors have been associated with weight loss: Weigh yourself daily Write down everything you eat Drink a glass of water prior to eating a meal Only eat when you are hungry Buy food from the periphery of the grocery store, not the middle  Follow up in one year or sooner if needed

## 2018-08-07 ENCOUNTER — Encounter (INDEPENDENT_AMBULATORY_CARE_PROVIDER_SITE_OTHER): Payer: Self-pay | Admitting: Family Medicine

## 2018-08-07 ENCOUNTER — Telehealth: Payer: Self-pay | Admitting: Pulmonary Disease

## 2018-08-07 ENCOUNTER — Ambulatory Visit (INDEPENDENT_AMBULATORY_CARE_PROVIDER_SITE_OTHER): Payer: Federal, State, Local not specified - PPO | Admitting: Family Medicine

## 2018-08-07 VITALS — BP 126/79 | HR 60 | Temp 97.6°F | Ht 69.0 in | Wt 266.0 lb

## 2018-08-07 DIAGNOSIS — Z9189 Other specified personal risk factors, not elsewhere classified: Secondary | ICD-10-CM | POA: Diagnosis not present

## 2018-08-07 DIAGNOSIS — R0602 Shortness of breath: Secondary | ICD-10-CM

## 2018-08-07 DIAGNOSIS — I10 Essential (primary) hypertension: Secondary | ICD-10-CM

## 2018-08-07 DIAGNOSIS — R5383 Other fatigue: Secondary | ICD-10-CM | POA: Diagnosis not present

## 2018-08-07 DIAGNOSIS — R7303 Prediabetes: Secondary | ICD-10-CM

## 2018-08-07 DIAGNOSIS — Z0289 Encounter for other administrative examinations: Secondary | ICD-10-CM

## 2018-08-07 DIAGNOSIS — Z1331 Encounter for screening for depression: Secondary | ICD-10-CM | POA: Diagnosis not present

## 2018-08-07 DIAGNOSIS — Z6839 Body mass index (BMI) 39.0-39.9, adult: Secondary | ICD-10-CM

## 2018-08-07 NOTE — Telephone Encounter (Signed)
Advised pt of results. Pt understood and nothing further is needed.    Notes recorded by Lupita Leash, MD on 08/07/2018 at 1:55 PM EST A, Please let the patient know this was OK Thanks, B

## 2018-08-08 LAB — T4, FREE: Free T4: 0.93 ng/dL (ref 0.82–1.77)

## 2018-08-08 LAB — COMPREHENSIVE METABOLIC PANEL
ALT: 28 IU/L (ref 0–44)
AST: 20 IU/L (ref 0–40)
Albumin/Globulin Ratio: 1.4 (ref 1.2–2.2)
Albumin: 4.2 g/dL (ref 3.5–5.5)
Alkaline Phosphatase: 78 IU/L (ref 39–117)
BUN/Creatinine Ratio: 18 (ref 9–20)
BUN: 16 mg/dL (ref 6–24)
Bilirubin Total: 0.8 mg/dL (ref 0.0–1.2)
CALCIUM: 9.3 mg/dL (ref 8.7–10.2)
CO2: 20 mmol/L (ref 20–29)
CREATININE: 0.87 mg/dL (ref 0.76–1.27)
Chloride: 106 mmol/L (ref 96–106)
GFR calc Af Amer: 112 mL/min/{1.73_m2} (ref >59)
GFR calc non Af Amer: 96 mL/min/{1.73_m2} (ref >59)
Globulin, Total: 2.9 g/dL (ref 1.5–4.5)
Glucose: 91 mg/dL (ref 65–99)
Potassium: 4.3 mmol/L (ref 3.5–5.2)
SODIUM: 142 mmol/L (ref 134–144)
Total Protein: 7.1 g/dL (ref 6.0–8.5)

## 2018-08-08 LAB — CBC WITH DIFFERENTIAL
Basophils Absolute: 0 10*3/uL (ref 0.0–0.2)
Basos: 1 %
EOS (ABSOLUTE): 0.2 10*3/uL (ref 0.0–0.4)
Eos: 3 %
Hematocrit: 41.7 % (ref 37.5–51.0)
Hemoglobin: 14.2 g/dL (ref 13.0–17.7)
IMMATURE GRANULOCYTES: 0 %
Immature Grans (Abs): 0 10*3/uL (ref 0.0–0.1)
LYMPHS ABS: 2.2 10*3/uL (ref 0.7–3.1)
Lymphs: 32 %
MCH: 28.6 pg (ref 26.6–33.0)
MCHC: 34.1 g/dL (ref 31.5–35.7)
MCV: 84 fL (ref 79–97)
Monocytes Absolute: 0.7 10*3/uL (ref 0.1–0.9)
Monocytes: 10 %
Neutrophils Absolute: 3.8 10*3/uL (ref 1.4–7.0)
Neutrophils: 54 %
RBC: 4.96 x10E6/uL (ref 4.14–5.80)
RDW: 14.5 % (ref 11.6–15.4)
WBC: 7 10*3/uL (ref 3.4–10.8)

## 2018-08-08 LAB — VITAMIN B12: VITAMIN B 12: 488 pg/mL (ref 232–1245)

## 2018-08-08 LAB — LIPID PANEL WITH LDL/HDL RATIO
Cholesterol, Total: 87 mg/dL — ABNORMAL LOW (ref 100–199)
HDL: 24 mg/dL — ABNORMAL LOW (ref >39)
LDL Calculated: 38 mg/dL (ref 0–99)
LDl/HDL Ratio: 1.6 ratio (ref 0.0–3.6)
Triglycerides: 126 mg/dL (ref 0–149)
VLDL Cholesterol Cal: 25 mg/dL (ref 5–40)

## 2018-08-08 LAB — HEMOGLOBIN A1C
Est. average glucose Bld gHb Est-mCnc: 134 mg/dL
Hgb A1c MFr Bld: 6.3 % — ABNORMAL HIGH (ref 4.8–5.6)

## 2018-08-08 LAB — VITAMIN D 25 HYDROXY (VIT D DEFICIENCY, FRACTURES): Vit D, 25-Hydroxy: 14.8 ng/mL — ABNORMAL LOW (ref 30.0–100.0)

## 2018-08-08 LAB — T3: T3 TOTAL: 108 ng/dL (ref 71–180)

## 2018-08-08 LAB — TSH: TSH: 1.7 u[IU]/mL (ref 0.450–4.500)

## 2018-08-08 LAB — INSULIN, RANDOM: INSULIN: 16.9 u[IU]/mL (ref 2.6–24.9)

## 2018-08-08 LAB — FOLATE: Folate: 14 ng/mL (ref >3.0)

## 2018-08-12 NOTE — Progress Notes (Signed)
Office: [phone]  /  Fax: [phone]   Dear Dr. Conley Morse,   Thank you for referring Jared Morse to our clinic. The following note includes my evaluation and treatment recommendations.  HPI:   Chief Complaint: OBESITY    Jared Morse has been referred by Jared Antuhao P. Le, DO for consultation regarding his obesity and obesity related comorbidities.    Jared Morse (MR# [id number]) is a 58 y.o. male who presents on 08/07/2018 for obesity evaluation and treatment. Current BMI is Body mass index is 39.28 kg/m.Marland Kitchen. Jared Morse has been struggling with his weight for many years and has been unsuccessful in either losing weight, maintaining weight loss, or reaching his healthy weight goal.     Jared Morse attended our information session and states he is currently in the action stage of change and ready to dedicate time achieving and maintaining a healthier weight. Jared Morse is interested in becoming our patient and working on intensive lifestyle modifications including (but not limited to) diet, exercise and weight loss.    Jared Morse states his family eats meals together he thinks his family will eat healthier with  him his desired weight loss is 66 lbs he started gaining weight 3 years ago his heaviest weight ever was 266 lbs he has significant food cravings issues  he snacks frequently in the evenings he skips meals frequently he frequently makes poor food choices he has problems with excessive hunger  he frequently eats larger portions than normal  he struggles with emotional eating    Fatigue Jared Morse feels his energy is lower than it should be. This has worsened with weight gain and has not worsened recently. Jared Morse denies daytime somnolence and  admits to waking up still tired. Patient has a history of obstructive sleep apnea. Patient generally gets 7 hours of sleep per night, and states they generally have generally restful sleep. Snoring is not present. Apneic episodes are present. Epworth Sleepiness  Score is 0.  Dyspnea on exertion Jared Morse notes increasing shortness of breath with exercising and seems to be worsening over time with weight gain. He notes getting out of breath sooner with activity than he used to. This has not gotten worse recently. EKG-Normal sinus rhythm (sees cardiology) nonspecific findings. Jared Morse denies orthopnea.  Hypertension Jared Morse is a 58 y.o. male with hypertension. Jared Morse is on carvedilol. He is status post stent in 2013/2014. He sees Jared Morse. He denies chest pain. He is working on weight loss to help control his blood pressure with the goal of decreasing his risk of heart attack and stroke. Jared Morse's blood pressure is currently controlled.  Pre-Diabetes Jared Morse has a diagnosis of pre-diabetes based on his elevated Hgb A1c of 6.0 on 01/13/18. He was informed this puts him at greater risk of developing diabetes. He is not on any medications and continues to work on diet and exercise to decrease risk of diabetes. He denies nausea or hypoglycemia.  At risk for diabetes Jared Morse is at higher than average risk for developing diabetes due to his obesity and pre-diabetes. He currently denies polyuria or polydipsia.  Depression Screen Jared Morse's Food and Mood (modified PHQ-9) score was  Depression screen PHQ 2/9 08/07/2018  Decreased Interest 1  Down, Depressed, Hopeless 1  PHQ - 2 Score 2  Altered sleeping 3  Tired, decreased energy 1  Change in appetite 1  Feeling bad or failure about yourself  0  Trouble concentrating 0  Moving slowly or fidgety/restless 0  Suicidal thoughts 0  PHQ-9 Score  7    ASSESSMENT AND PLAN:  Other fatigue - Plan: EKG 12-Lead, Vitamin B12, CBC With Differential, Folate, Lipid Panel With LDL/HDL Ratio, T3, T4, free, TSH, VITAMIN D 25 Hydroxy (Vit-D Deficiency, Fractures)  Shortness of breath on exertion  Essential hypertension  Prediabetes - Plan: Comprehensive metabolic panel, Hemoglobin A1c, Insulin, random  Depression  screening  At risk for diabetes mellitus  Class 2 severe obesity with serious comorbidity and body mass index (BMI) of 39.0 to 39.9 in adult, unspecified obesity type (HCC)  PLAN:  Fatigue Jared Morse was informed that his fatigue may be related to obesity, depression or many other causes. Labs will be ordered, and in the meanwhile [name] has agreed to work on diet, exercise and weight loss to help with fatigue. Proper sleep hygiene was discussed including the need for 7-8 hours of quality sleep each night. A sleep study was not ordered based on symptoms and Epworth score.  Dyspnea on exertion Jared Morse's shortness of breath appears to be obesity related and exercise induced. He has agreed to work on weight loss and gradually increase exercise to treat his exercise induced shortness of breath. If Jared Morse follows our instructions and loses weight without improvement of his shortness of breath, we will plan to refer to pulmonology. We will monitor this condition regularly. Jared Morse agrees to this plan.  Hypertension We discussed sodium restriction, working on healthy weight loss, and a regular exercise program as the means to achieve improved blood pressure control. Jared Morse agreed with this plan and agreed to follow up as directed. We will continue to monitor his blood pressure as well as his progress with the above lifestyle modifications. Jared Morse agrees to continue his medications as prescribed and will watch for signs of hypotension as he continues his lifestyle modifications. EKG was done, and we will check CMP and FLP today. Jared Morse agrees to follow up with our clinic in 2 weeks.  Pre-Diabetes Jared Morse will continue to work on weight loss, exercise, and decreasing simple carbohydrates in his diet to help decrease the risk of diabetes. We dicussed metformin including benefits and risks. He was informed that eating too many simple carbohydrates or too many calories at one sitting increases the likelihood of GI  side effects. Jared Morse declined metformin for now and a prescription was not written today. We will check Hgb A1c and insulin today. Jared Morse agrees to follow up with our clinic in 2 weeks as directed to monitor his progress.  Diabetes risk counselling Jared Morse was given extended (15 minutes) diabetes prevention counseling today. He is 58 y.o. male and has risk factors for diabetes including obesity and pre-diabetes. We discussed intensive lifestyle modifications today with an emphasis on weight loss as well as increasing exercise and decreasing simple carbohydrates in his diet.  Depression Screen Jared Morse had a mildly positive depression screening. Depression is commonly associated with obesity and often results in emotional eating behaviors. We will monitor this closely and work on CBT to help improve the non-hunger eating patterns. Referral to Psychology may be required if no improvement is seen as he continues in our clinic.  Obesity Jared Morse is currently in the action stage of change and his goal is to continue with weight loss efforts. I recommend Jared Morse begin the structured treatment plan as follows:  He has agreed to follow the Category 4 plan Wassim has been instructed to eventually work up to a goal of 150 minutes of combined cardio and strengthening exercise per week for weight loss and overall health benefits. We  discussed the following Behavioral Modification Strategies today: increasing lean protein intake, increasing vegetables, work on meal planning and easy cooking plans, and planning for success   He was informed of the importance of frequent follow up visits to maximize his success with intensive lifestyle modifications for his multiple health conditions. He was informed we would discuss his lab results at his next visit unless there is a critical issue that needs to be addressed sooner. Jared Morse agreed to keep his next visit at the agreed upon time to discuss these results.  ALLERGIES: No  Known Allergies  MEDICATIONS: Current Outpatient Medications on File Prior to Visit  Medication Sig Dispense Refill  . aspirin 81 MG tablet Take 81 mg by mouth daily.    Marland Kitchen atorvastatin (LIPITOR) 80 MG tablet Take 80 mg by mouth daily.    . carvedilol (COREG) 12.5 MG tablet Take 1 tablet (12.5 mg total) by mouth 2 (two) times daily with a meal. 180 tablet 3  . nitroGLYCERIN (NITROSTAT) 0.4 MG SL tablet Place 0.4 mg under the tongue every 5 (five) minutes as needed for chest pain.  12   No current facility-administered medications on file prior to visit.     PAST MEDICAL HISTORY: Past Medical History:  Diagnosis Date  . Allergy   . Asthma   . CAD (coronary artery disease)    EF = 0.40%,mild AR,RVSP=46mmHG  ECHO 08/26/13  . Coronary artery disease    consistent with angina/ NON STEMI (non ST elevated myocardial infaraction)  . Heart disease   . History of chicken pox   . Hypertension   . Myocardial infarction (HCC) 07/2013  . Prediabetes   . Sleep apnea     PAST SURGICAL HISTORY: Past Surgical History:  Procedure Laterality Date  . CORONARY ANGIOPLASTY WITH STENT PLACEMENT  08/06/13   DES to RCA in setting of NSTEMI, 50-60 %LAD    SOCIAL HISTORY: Social History   Tobacco Use  . Smoking status: Never Smoker  . Smokeless tobacco: Never Used  Substance Use Topics  . Alcohol use: No  . Drug use: No    FAMILY HISTORY: Family History  Problem Relation Age of Onset  . Hypertension Mother   . Diabetes Mother   . Heart disease Mother   . Hyperlipidemia Mother   . Depression Mother   . Anxiety disorder Mother   . Obesity Mother   . COPD Father   . COPD Sister   . Stroke Sister     ROS: Review of Systems  Constitutional: Positive for malaise/fatigue. Negative for weight loss.  Respiratory: Positive for shortness of breath.   Cardiovascular: Negative for chest pain and orthopnea.  Gastrointestinal: Negative for nausea.  Genitourinary: Negative for frequency.    Musculoskeletal: Positive for back pain.  Morse/Heme/Allergies: Negative for polydipsia.       Negative hypoglycemia    PHYSICAL EXAM: Blood pressure 126/79, pulse 60, temperature 97.6 F (36.4 C), temperature source Oral, height 5\' 9"  (1.753 m), weight 266 lb (120.7 kg), SpO2 94 %. Body mass index is 39.28 kg/m. Physical Exam Vitals signs reviewed.  Constitutional:      Appearance: Normal appearance. He is obese.  HENT:     Head: Normocephalic and atraumatic.     Nose: Nose normal.  Eyes:     General: No scleral icterus.    Extraocular Movements: Extraocular movements intact.  Neck:     Musculoskeletal: Normal range of motion and neck supple.     Comments: No thyromegaly present Cardiovascular:  Rate and Rhythm: Normal rate and regular rhythm.     Pulses: Normal pulses.     Heart sounds: Normal heart sounds.  Pulmonary:     Effort: Pulmonary effort is normal. No respiratory distress.     Breath sounds: Normal breath sounds.  Abdominal:     Palpations: Abdomen is soft.     Tenderness: There is no abdominal tenderness.     Comments: + Obesity  Musculoskeletal: Normal range of motion.     Right lower leg: No edema.     Left lower leg: No edema.  Skin:    General: Skin is warm and dry.  Neurological:     Mental Status: He is alert and oriented to person, place, and time.     Coordination: Coordination normal.  Psychiatric:        Mood and Affect: Mood normal.        Behavior: Behavior normal.     RECENT LABS AND TESTS: BMET    Component Value Date/Time   NA 142 08/07/2018 1155   K 4.3 08/07/2018 1155   CL 106 08/07/2018 1155   CO2 20 08/07/2018 1155   GLUCOSE 91 08/07/2018 1155   GLUCOSE 124 (H) 09/22/2017 0227   BUN 16 08/07/2018 1155   CREATININE 0.87 08/07/2018 1155   CALCIUM 9.3 08/07/2018 1155   GFRNONAA 96 08/07/2018 1155   GFRAA 112 08/07/2018 1155   Lab Results  Component Value Date   HGBA1C 6.3 (H) 08/07/2018   Lab Results  Component  Value Date   INSULIN 16.9 08/07/2018   CBC    Component Value Date/Time   WBC 7.0 08/07/2018 1155   WBC 8.9 09/21/2017 0836   RBC 4.96 08/07/2018 1155   RBC 4.53 09/21/2017 0836   HGB 14.2 08/07/2018 1155   HCT 41.7 08/07/2018 1155   PLT 306 09/21/2017 0836   MCV 84 08/07/2018 1155   MCH 28.6 08/07/2018 1155   MCH 28.9 09/21/2017 0836   MCHC 34.1 08/07/2018 1155   MCHC 33.2 09/21/2017 0836   RDW 14.5 08/07/2018 1155   LYMPHSABS 2.2 08/07/2018 1155   MONOABS 0.8 09/20/2017 0253   EOSABS 0.2 08/07/2018 1155   BASOSABS 0.0 08/07/2018 1155   Iron/TIBC/Ferritin/ %Sat No results found for: IRON, TIBC, FERRITIN, IRONPCTSAT Lipid Panel     Component Value Date/Time   CHOL 87 (L) 08/07/2018 1155   TRIG 126 08/07/2018 1155   HDL 24 (L) 08/07/2018 1155   LDLCALC 38 08/07/2018 1155   Hepatic Function Panel     Component Value Date/Time   PROT 7.1 08/07/2018 1155   ALBUMIN 4.2 08/07/2018 1155   AST 20 08/07/2018 1155   ALT 28 08/07/2018 1155   ALKPHOS 78 08/07/2018 1155   BILITOT 0.8 08/07/2018 1155      Component Value Date/Time   TSH 1.700 08/07/2018 1155   TSH 0.470 09/12/2017 0052    ECG  shows NSR with a rate of 68 BPM INDIRECT CALORIMETER done today shows a VO2 of 370 and a REE of 2578.  His calculated basal metabolic rate is 7681 thus his basal metabolic rate is better than expected.       OBESITY BEHAVIORAL INTERVENTION VISIT  Today's visit was # 1   Starting weight: 266 lbs Starting date: 08/07/2018 Today's weight : 266 lbs  Today's date: 08/07/2018 Total lbs lost to date: 0    ASK: We discussed the diagnosis of obesity with Jared Morse M Oettinger today and Trayvion agreed to give [place] permission  to discuss obesity behavioral modification therapy today.  ASSESS: Yusif has the diagnosis of obesity and his BMI today is 39.26 Alonza is in the action stage of change   ADVISE: Elyas was educated on the multiple health risks of obesity as well as the benefit of  weight loss to improve his health. He was advised of the need for long term treatment and the importance of lifestyle modifications to improve his current health and to decrease his risk of future health problems.  AGREE: Multiple dietary modification options and treatment options were discussed and  Jaceon agreed to follow the recommendations documented in the above note.  ARRANGE: Taelyn was educated on the importance of frequent visits to treat obesity as outlined per CMS and USPSTF guidelines and agreed to schedule his next follow up appointment today.  I, Burt KnackSharon Martin, am acting as transcriptionist for Debbra RidingAlexandria Kadolph, MD   I have reviewed the above documentation for accuracy and completeness, and I agree with the above. - Debbra RidingAlexandria Kadolph, MD

## 2018-08-13 ENCOUNTER — Encounter (INDEPENDENT_AMBULATORY_CARE_PROVIDER_SITE_OTHER): Payer: Self-pay | Admitting: Family Medicine

## 2018-08-13 DIAGNOSIS — J9601 Acute respiratory failure with hypoxia: Secondary | ICD-10-CM | POA: Diagnosis not present

## 2018-08-13 DIAGNOSIS — G4733 Obstructive sleep apnea (adult) (pediatric): Secondary | ICD-10-CM | POA: Diagnosis not present

## 2018-08-21 ENCOUNTER — Ambulatory Visit (INDEPENDENT_AMBULATORY_CARE_PROVIDER_SITE_OTHER): Payer: Federal, State, Local not specified - PPO | Admitting: Family Medicine

## 2018-08-21 ENCOUNTER — Encounter (INDEPENDENT_AMBULATORY_CARE_PROVIDER_SITE_OTHER): Payer: Self-pay | Admitting: Family Medicine

## 2018-08-21 VITALS — BP 120/76 | HR 67 | Temp 98.5°F | Ht 69.0 in | Wt 261.0 lb

## 2018-08-21 DIAGNOSIS — Z9189 Other specified personal risk factors, not elsewhere classified: Secondary | ICD-10-CM | POA: Diagnosis not present

## 2018-08-21 DIAGNOSIS — E559 Vitamin D deficiency, unspecified: Secondary | ICD-10-CM

## 2018-08-21 DIAGNOSIS — Z6838 Body mass index (BMI) 38.0-38.9, adult: Secondary | ICD-10-CM | POA: Diagnosis not present

## 2018-08-21 DIAGNOSIS — R7303 Prediabetes: Secondary | ICD-10-CM | POA: Diagnosis not present

## 2018-08-21 MED ORDER — METFORMIN HCL 500 MG PO TABS: 500.0000 mg | ORAL_TABLET | Freq: Every day | ORAL | 0 refills | Status: DC

## 2018-08-21 MED ORDER — VITAMIN D (ERGOCALCIFEROL) 1.25 MG (50000 UNIT) PO CAPS: 50000.0000 [IU] | ORAL_CAPSULE | ORAL | 0 refills | Status: AC

## 2018-08-24 NOTE — Progress Notes (Signed)
Office: [phone]  /  Fax: [phone]   HPI:   Chief Complaint: OBESITY Jared Morse is here to discuss his progress with his obesity treatment plan. He is on the Category 4 plan and is following his eating plan approximately 70 % of the time. He states he is running 3 miles 7 times per week. Jared Morse is still getting the munchies at night for cookies and milk. He overindulged on the weekend secondary to going out to eat and eating more sweets. He is getting in approximately 8 oz of meat at night.  His weight is 261 lb (118.4 kg) today and has had a weight loss of 5 pounds over a period of 2 weeks since his last visit. He has lost 5 lbs since starting treatment with [place].  Vitamin D Deficiency Jared Morse has a diagnosis of vitamin D deficiency. He is not currently taking OTC Vit D and denies nausea, vomiting or muscle weakness.  Pre-Diabetes Jared Morse has a diagnosis of pre-diabetes based on his elevated Hgb A1c at 6.3 (increased from 6.0). He was informed this puts him at greater risk of developing diabetes. He is not taking metformin currently and notes carbohydrate cravings, especially at night. He continues to work on diet and exercise to decrease risk of diabetes. He denies nausea or hypoglycemia.  At risk for diabetes Jared Morse is at higher than average risk for developing diabetes due to his obesity and pre-diabetes. He currently denies polyuria or polydipsia.  ASSESSMENT AND PLAN:  Vitamin D deficiency - Plan: Vitamin D, Ergocalciferol, (DRISDOL) 1.25 MG (50000 UT) CAPS capsule  Prediabetes - Plan: metFORMIN (GLUCOPHAGE) 500 MG tablet  At risk for diabetes mellitus  Class 2 severe obesity with serious comorbidity and body mass index (BMI) of 38.0 to 38.9 in adult, unspecified obesity type (HCC)  PLAN:  Vitamin D Deficiency Jared Morse was informed that low vitamin D levels contributes to fatigue and are associated with obesity, breast, and colon cancer. Jared Morse agrees to start prescription Vit  D @50 ,000 IU every week #4 with no refills. He will follow up for routine testing of vitamin D, at least 2-3 times per year. He was informed of the risk of over-replacement of vitamin D and agrees to not increase his dose unless he discusses this with [place] first. Jared Morse agrees to follow up with our clinic in 2 weeks.  Pre-Diabetes Jared Morse will continue to work on weight loss, exercise, and decreasing simple carbohydrates in his diet to help decrease the risk of diabetes. We dicussed metformin including benefits and risks. He was informed that eating too many simple carbohydrates or too many calories at one sitting increases the likelihood of GI side effects. Jared Morse agrees to start metformin 500 mg PO q AM #30 with no refills. Jared Morse agrees to follow up with our clinic in 2 weeks as directed to monitor his progress.  Diabetes risk counselling Jared Morse was given extended (30 minutes) diabetes prevention counseling today. He is 58 y.o. male and has risk factors for diabetes including obesity and pre-diabetes. We discussed intensive lifestyle modifications today with an emphasis on weight loss as well as increasing exercise and decreasing simple carbohydrates in his diet.  Obesity Jared Morse is currently in the action stage of change. As such, his goal is to continue with weight loss efforts He has agreed to follow the Category 4 plan Jared Morse has been instructed to work up to a goal of 150 minutes of combined cardio and strengthening exercise per week for weight loss and overall health benefits.  We discussed the following Behavioral Modification Strategies today: increasing lean protein intake, increasing vegetables and work on meal planning and easy cooking plans, and planning for success   Jared Morse has agreed to follow up with our clinic in 2 weeks. He was informed of the importance of frequent follow up visits to maximize his success with intensive lifestyle modifications for his multiple health  conditions.  ALLERGIES: No Known Allergies  MEDICATIONS: Current Outpatient Medications on File Prior to Visit  Medication Sig Dispense Refill  . aspirin 81 MG tablet Take 81 mg by mouth daily.    Marland Kitchen atorvastatin (LIPITOR) 80 MG tablet Take 80 mg by mouth daily.    . carvedilol (COREG) 12.5 MG tablet Take 1 tablet (12.5 mg total) by mouth 2 (two) times daily with a meal. 180 tablet 3  . nitroGLYCERIN (NITROSTAT) 0.4 MG SL tablet Place 0.4 mg under the tongue every 5 (five) minutes as needed for chest pain.  12   No current facility-administered medications on file prior to visit.     PAST MEDICAL HISTORY: Past Medical History:  Diagnosis Date  . Allergy   . Asthma   . CAD (coronary artery disease)    EF = 0.40%,mild AR,RVSP=31mmHG  ECHO 08/26/13  . Coronary artery disease    consistent with angina/ NON STEMI (non ST elevated myocardial infaraction)  . Heart disease   . History of chicken pox   . Hypertension   . Myocardial infarction (HCC) 07/2013  . Prediabetes   . Sleep apnea     PAST SURGICAL HISTORY: Past Surgical History:  Procedure Laterality Date  . CORONARY ANGIOPLASTY WITH STENT PLACEMENT  08/06/13   DES to RCA in setting of NSTEMI, 50-60 %LAD    SOCIAL HISTORY: Social History   Tobacco Use  . Smoking status: Never Smoker  . Smokeless tobacco: Never Used  Substance Use Topics  . Alcohol use: No  . Drug use: No    FAMILY HISTORY: Family History  Problem Relation Age of Onset  . Hypertension Mother   . Diabetes Mother   . Heart disease Mother   . Hyperlipidemia Mother   . Depression Mother   . Anxiety disorder Mother   . Obesity Mother   . COPD Father   . COPD Sister   . Stroke Sister     ROS: Review of Systems  Constitutional: Positive for weight loss.  Gastrointestinal: Negative for nausea and vomiting.  Genitourinary: Negative for frequency.  Musculoskeletal:       Negative muscle weakness  Endo/Heme/Allergies: Negative for polydipsia.        Negative hypoglycemia    PHYSICAL EXAM: Blood pressure 120/76, pulse 67, temperature 98.5 F (36.9 C), temperature source Oral, height 5\' 9"  (1.753 m), weight 261 lb (118.4 kg). Body mass index is 38.54 kg/m. Physical Exam Vitals signs reviewed.  Constitutional:      Appearance: Normal appearance. He is obese.  Cardiovascular:     Rate and Rhythm: Normal rate.     Pulses: Normal pulses.  Pulmonary:     Effort: Pulmonary effort is normal.     Breath sounds: Normal breath sounds.  Musculoskeletal: Normal range of motion.  Skin:    General: Skin is warm and dry.  Neurological:     Mental Status: He is alert and oriented to person, place, and time.  Psychiatric:        Mood and Affect: Mood normal.        Behavior: Behavior normal.  RECENT LABS AND TESTS: BMET    Component Value Date/Time   NA 142 08/07/2018 1155   K 4.3 08/07/2018 1155   CL 106 08/07/2018 1155   CO2 20 08/07/2018 1155   GLUCOSE 91 08/07/2018 1155   GLUCOSE 124 (H) 09/22/2017 0227   BUN 16 08/07/2018 1155   CREATININE 0.87 08/07/2018 1155   CALCIUM 9.3 08/07/2018 1155   GFRNONAA 96 08/07/2018 1155   GFRAA 112 08/07/2018 1155   Lab Results  Component Value Date   HGBA1C 6.3 (H) 08/07/2018   Lab Results  Component Value Date   INSULIN 16.9 08/07/2018   CBC    Component Value Date/Time   WBC 7.0 08/07/2018 1155   WBC 8.9 09/21/2017 0836   RBC 4.96 08/07/2018 1155   RBC 4.53 09/21/2017 0836   HGB 14.2 08/07/2018 1155   HCT 41.7 08/07/2018 1155   PLT 306 09/21/2017 0836   MCV 84 08/07/2018 1155   MCH 28.6 08/07/2018 1155   MCH 28.9 09/21/2017 0836   MCHC 34.1 08/07/2018 1155   MCHC 33.2 09/21/2017 0836   RDW 14.5 08/07/2018 1155   LYMPHSABS 2.2 08/07/2018 1155   MONOABS 0.8 09/20/2017 0253   EOSABS 0.2 08/07/2018 1155   BASOSABS 0.0 08/07/2018 1155   Iron/TIBC/Ferritin/ %Sat No results found for: IRON, TIBC, FERRITIN, IRONPCTSAT Lipid Panel     Component Value Date/Time    CHOL 87 (L) 08/07/2018 1155   TRIG 126 08/07/2018 1155   HDL 24 (L) 08/07/2018 1155   LDLCALC 38 08/07/2018 1155   Hepatic Function Panel     Component Value Date/Time   PROT 7.1 08/07/2018 1155   ALBUMIN 4.2 08/07/2018 1155   AST 20 08/07/2018 1155   ALT 28 08/07/2018 1155   ALKPHOS 78 08/07/2018 1155   BILITOT 0.8 08/07/2018 1155      Component Value Date/Time   TSH 1.700 08/07/2018 1155   TSH 0.470 09/12/2017 0052      OBESITY BEHAVIORAL INTERVENTION VISIT  Today's visit was # 2   Starting weight: 266 lbs Starting date: 08/07/2018 Today's weight : 261 lbs  Today's date: 08/21/2018 Total lbs lost to date: 5    ASK: We discussed the diagnosis of obesity with Warwick M Cuadrado today and Remo agreed to give [place] permission to discuss obesity behavioral modification therapy today.  ASSESS: Julus has the diagnosis of obesity and his BMI today is 38.53 Jami is in the action stage of change   ADVISE: Promise was educated on the multiple health risks of obesity as well as the benefit of weight loss to improve his health. He was advised of the need for long term treatment and the importance of lifestyle modifications to improve his current health and to decrease his risk of future health problems.  AGREE: Multiple dietary modification options and treatment options were discussed and  Krishiv agreed to follow the recommendations documented in the above note.  ARRANGE: Olander was educated on the importance of frequent visits to treat obesity as outlined per CMS and USPSTF guidelines and agreed to schedule his next follow up appointment today.  I, Burt Knack, am acting as transcriptionist for Debbra Riding, MD  I have reviewed the above documentation for accuracy and completeness, and I agree with the above. - Debbra Riding, MD

## 2018-08-25 ENCOUNTER — Encounter (INDEPENDENT_AMBULATORY_CARE_PROVIDER_SITE_OTHER): Payer: Self-pay | Admitting: Family Medicine

## 2018-08-26 DIAGNOSIS — M5126 Other intervertebral disc displacement, lumbar region: Secondary | ICD-10-CM | POA: Diagnosis not present

## 2018-08-26 DIAGNOSIS — M5116 Intervertebral disc disorders with radiculopathy, lumbar region: Secondary | ICD-10-CM | POA: Diagnosis not present

## 2018-08-26 DIAGNOSIS — M5432 Sciatica, left side: Secondary | ICD-10-CM | POA: Diagnosis not present

## 2018-08-26 DIAGNOSIS — M545 Low back pain: Secondary | ICD-10-CM | POA: Diagnosis not present

## 2018-08-26 DIAGNOSIS — M4726 Other spondylosis with radiculopathy, lumbar region: Secondary | ICD-10-CM | POA: Diagnosis not present

## 2018-09-04 ENCOUNTER — Encounter (INDEPENDENT_AMBULATORY_CARE_PROVIDER_SITE_OTHER): Payer: Self-pay | Admitting: Family Medicine

## 2018-09-04 ENCOUNTER — Ambulatory Visit (INDEPENDENT_AMBULATORY_CARE_PROVIDER_SITE_OTHER): Payer: Federal, State, Local not specified - PPO | Admitting: Family Medicine

## 2018-09-04 VITALS — BP 150/76 | HR 57 | Temp 98.3°F | Ht 69.0 in | Wt 252.0 lb

## 2018-09-04 DIAGNOSIS — Z6837 Body mass index (BMI) 37.0-37.9, adult: Secondary | ICD-10-CM

## 2018-09-04 DIAGNOSIS — R7303 Prediabetes: Secondary | ICD-10-CM | POA: Diagnosis not present

## 2018-09-04 DIAGNOSIS — E66812 Obesity, class 2: Secondary | ICD-10-CM

## 2018-09-04 DIAGNOSIS — E559 Vitamin D deficiency, unspecified: Secondary | ICD-10-CM

## 2018-09-04 DIAGNOSIS — Z9189 Other specified personal risk factors, not elsewhere classified: Secondary | ICD-10-CM

## 2018-09-04 MED ORDER — METFORMIN HCL 500 MG PO TABS: 500.0000 mg | ORAL_TABLET | Freq: Every day | ORAL | 0 refills | Status: DC

## 2018-09-06 NOTE — Progress Notes (Signed)
Office: [phone]  /  Fax: [phone]   HPI:   Chief Complaint: OBESITY Jared Morse is here to discuss his progress with his obesity treatment plan. He is on the Category 4 plan and is following his eating plan approximately 70-80 % of the time. He states he is walking for 30 minutes 7 times per week. Jared Morse reports he has been moving stuff around to get all food in secondary to work schedule. His snacks include applesauce and bananas.  His weight is 252 lb (114.3 kg) today and has had a weight loss of 9 pounds over a period of 2 weeks since his last visit. He has lost 14 lbs since starting treatment with [place].  Pre-Diabetes Jared Morse has a diagnosis of pre-diabetes based on his elevated Hgb A1c and was informed this puts him at greater risk of developing diabetes. He denies GI side effects of metformin. He notes improved carbohydrate cravings and continues to work on diet and exercise to decrease risk of diabetes. He denies hypoglycemia.  At risk for diabetes Jared Morse is at higher than average risk for developing diabetes due to his obesity and pre-diabetes. He currently denies polyuria or polydipsia.  Vitamin D Deficiency Jared Morse has a diagnosis of vitamin D deficiency. He is currently taking prescription Vit D. He notes fatigue and denies nausea, vomiting or muscle weakness.  ASSESSMENT AND PLAN:  Prediabetes - Plan: metFORMIN (GLUCOPHAGE) 500 MG tablet  Vitamin D deficiency  At risk for diabetes mellitus  Class 2 severe obesity with serious comorbidity and body mass index (BMI) of 37.0 to 37.9 in adult, unspecified obesity type (HCC)  PLAN:  Pre-Diabetes Jared Morse will continue to work on weight loss, exercise, and decreasing simple carbohydrates in his diet to help decrease the risk of diabetes. We dicussed metformin including benefits and risks. He was informed that eating too many simple carbohydrates or too many calories at one sitting increases the likelihood of GI side effects.  [name] agrees to continue taking metformin 500 mg PO q AM #30 and we will refill for 1 month. Jared Morse agrees to follow up with our clinic in 2 weeks as directed to monitor his progress.  Diabetes risk counseling Jared Morse was given extended (15 minutes) diabetes prevention counseling today. He is 58 y.o. male and has risk factors for diabetes including obesity and pre-diabetes. We discussed intensive lifestyle modifications today with an emphasis on weight loss as well as increasing exercise and decreasing simple carbohydrates in his diet.  Vitamin D Deficiency Jared Morse was informed that low vitamin D levels contributes to fatigue and are associated with obesity, breast, and colon cancer. Jared Morse agrees to continue taking prescription Vit D @50 ,000 IU every week, no refills needed. He will follow up for routine testing of vitamin D, at least 2-3 times per year. He was informed of the risk of over-replacement of vitamin D and agrees to not increase his dose unless he discusses this with [place] first. Jared Morse agrees to follow up with our clinic in 2 weeks.  Obesity Jared Morse is currently in the action stage of change. As such, his goal is to continue with weight loss efforts He has agreed to follow the Category 4 plan Jared Morse has been instructed to work up to a goal of 150 minutes of combined cardio and strengthening exercise per week for weight loss and overall health benefits. We discussed the following Behavioral Modification Strategies today: increasing lean protein intake, increasing vegetables and work on meal planning and easy cooking plans, and planning for success  Jared Morse has agreed to follow up with our clinic in 2 weeks. He was informed of the importance of frequent follow up visits to maximize his success with intensive lifestyle modifications for his multiple health conditions.  ALLERGIES: No Known Allergies  MEDICATIONS: Current Outpatient Medications on File Prior to Visit  Medication Sig  Dispense Refill  . aspirin 81 MG tablet Take 81 mg by mouth daily.    Marland Kitchen atorvastatin (LIPITOR) 80 MG tablet Take 80 mg by mouth daily.    . carvedilol (COREG) 12.5 MG tablet Take 1 tablet (12.5 mg total) by mouth 2 (two) times daily with a meal. 180 tablet 3  . nitroGLYCERIN (NITROSTAT) 0.4 MG SL tablet Place 0.4 mg under the tongue every 5 (five) minutes as needed for chest pain.  12  . Vitamin D, Ergocalciferol, (DRISDOL) 1.25 MG (50000 UT) CAPS capsule Take 1 capsule (50,000 Units total) by mouth every 7 (seven) days. 4 capsule 0   No current facility-administered medications on file prior to visit.     PAST MEDICAL HISTORY: Past Medical History:  Diagnosis Date  . Allergy   . Asthma   . CAD (coronary artery disease)    EF = 0.40%,mild AR,RVSP=65mmHG  ECHO 08/26/13  . Coronary artery disease    consistent with angina/ NON STEMI (non ST elevated myocardial infaraction)  . Heart disease   . History of chicken pox   . Hypertension   . Myocardial infarction (HCC) 07/2013  . Prediabetes   . Sleep apnea     PAST SURGICAL HISTORY: Past Surgical History:  Procedure Laterality Date  . CORONARY ANGIOPLASTY WITH STENT PLACEMENT  08/06/13   DES to RCA in setting of NSTEMI, 50-60 %LAD    SOCIAL HISTORY: Social History   Tobacco Use  . Smoking status: Never Smoker  . Smokeless tobacco: Never Used  Substance Use Topics  . Alcohol use: No  . Drug use: No    FAMILY HISTORY: Family History  Problem Relation Age of Onset  . Hypertension Mother   . Diabetes Mother   . Heart disease Mother   . Hyperlipidemia Mother   . Depression Mother   . Anxiety disorder Mother   . Obesity Mother   . COPD Father   . COPD Sister   . Stroke Sister     ROS: Review of Systems  Constitutional: Positive for malaise/fatigue and weight loss.  Gastrointestinal: Negative for nausea and vomiting.  Genitourinary: Negative for frequency.  Musculoskeletal:       Negative muscle weakness    Endo/Heme/Allergies: Negative for polydipsia.       Negative hypoglycemia    PHYSICAL EXAM: Blood pressure (!) 150/76, pulse (!) 57, temperature 98.3 F (36.8 C), temperature source Oral, height 5\' 9"  (1.753 m), weight 252 lb (114.3 kg), SpO2 97 %. Body mass index is 37.21 kg/m. Physical Exam Vitals signs reviewed.  Constitutional:      Appearance: Normal appearance. He is obese.  Cardiovascular:     Rate and Rhythm: Normal rate.     Pulses: Normal pulses.  Pulmonary:     Effort: Pulmonary effort is normal.     Breath sounds: Normal breath sounds.  Musculoskeletal: Normal range of motion.  Skin:    General: Skin is warm and dry.  Neurological:     Mental Status: He is alert and oriented to person, place, and time.  Psychiatric:        Mood and Affect: Mood normal.        Behavior:  Behavior normal.     RECENT LABS AND TESTS: BMET    Component Value Date/Time   NA 142 08/07/2018 1155   K 4.3 08/07/2018 1155   CL 106 08/07/2018 1155   CO2 20 08/07/2018 1155   GLUCOSE 91 08/07/2018 1155   GLUCOSE 124 (H) 09/22/2017 0227   BUN 16 08/07/2018 1155   CREATININE 0.87 08/07/2018 1155   CALCIUM 9.3 08/07/2018 1155   GFRNONAA 96 08/07/2018 1155   GFRAA 112 08/07/2018 1155   Lab Results  Component Value Date   HGBA1C 6.3 (H) 08/07/2018   Lab Results  Component Value Date   INSULIN 16.9 08/07/2018   CBC    Component Value Date/Time   WBC 7.0 08/07/2018 1155   WBC 8.9 09/21/2017 0836   RBC 4.96 08/07/2018 1155   RBC 4.53 09/21/2017 0836   HGB 14.2 08/07/2018 1155   HCT 41.7 08/07/2018 1155   PLT 306 09/21/2017 0836   MCV 84 08/07/2018 1155   MCH 28.6 08/07/2018 1155   MCH 28.9 09/21/2017 0836   MCHC 34.1 08/07/2018 1155   MCHC 33.2 09/21/2017 0836   RDW 14.5 08/07/2018 1155   LYMPHSABS 2.2 08/07/2018 1155   MONOABS 0.8 09/20/2017 0253   EOSABS 0.2 08/07/2018 1155   BASOSABS 0.0 08/07/2018 1155   Iron/TIBC/Ferritin/ %Sat No results found for: IRON, TIBC,  FERRITIN, IRONPCTSAT Lipid Panel     Component Value Date/Time   CHOL 87 (L) 08/07/2018 1155   TRIG 126 08/07/2018 1155   HDL 24 (L) 08/07/2018 1155   LDLCALC 38 08/07/2018 1155   Hepatic Function Panel     Component Value Date/Time   PROT 7.1 08/07/2018 1155   ALBUMIN 4.2 08/07/2018 1155   AST 20 08/07/2018 1155   ALT 28 08/07/2018 1155   ALKPHOS 78 08/07/2018 1155   BILITOT 0.8 08/07/2018 1155      Component Value Date/Time   TSH 1.700 08/07/2018 1155   TSH 0.470 09/12/2017 0052      OBESITY BEHAVIORAL INTERVENTION VISIT  Today's visit was # 3   Starting weight: 266 lbs Starting date: 08/07/2018 Today's weight : 252 lbs  Today's date: 09/04/2018 Total lbs lost to date: 14    ASK: We discussed the diagnosis of obesity with Jared Morse today and Miloh agreed to give [place] permission to discuss obesity behavioral modification therapy today.  ASSESS: Jared Morse has the diagnosis of obesity and his BMI today is 37.2 Jared Morse is in the action stage of change   ADVISE: Jared Morse was educated on the multiple health risks of obesity as well as the benefit of weight loss to improve his health. He was advised of the need for long term treatment and the importance of lifestyle modifications to improve his current health and to decrease his risk of future health problems.  AGREE: Multiple dietary modification options and treatment options were discussed and  Mani agreed to follow the recommendations documented in the above note.  ARRANGE: Jared Morse was educated on the importance of frequent visits to treat obesity as outlined per CMS and USPSTF guidelines and agreed to schedule his next follow up appointment today.  I, Burt Knack, am acting as transcriptionist for Debbra Riding, MD  I have reviewed the above documentation for accuracy and completeness, and I agree with the above. - Debbra Riding, MD

## 2018-09-12 ENCOUNTER — Other Ambulatory Visit (INDEPENDENT_AMBULATORY_CARE_PROVIDER_SITE_OTHER): Payer: Self-pay | Admitting: Family Medicine

## 2018-09-12 DIAGNOSIS — E559 Vitamin D deficiency, unspecified: Secondary | ICD-10-CM

## 2018-09-14 ENCOUNTER — Other Ambulatory Visit (INDEPENDENT_AMBULATORY_CARE_PROVIDER_SITE_OTHER): Payer: Self-pay | Admitting: Family Medicine

## 2018-09-14 DIAGNOSIS — R7303 Prediabetes: Secondary | ICD-10-CM

## 2018-09-15 DIAGNOSIS — M5126 Other intervertebral disc displacement, lumbar region: Secondary | ICD-10-CM | POA: Diagnosis not present

## 2018-09-22 ENCOUNTER — Encounter (INDEPENDENT_AMBULATORY_CARE_PROVIDER_SITE_OTHER): Payer: Self-pay | Admitting: Family Medicine

## 2018-09-22 ENCOUNTER — Ambulatory Visit (INDEPENDENT_AMBULATORY_CARE_PROVIDER_SITE_OTHER): Payer: Federal, State, Local not specified - PPO | Admitting: Family Medicine

## 2018-09-22 VITALS — BP 144/72 | HR 64 | Temp 98.5°F | Ht 69.0 in | Wt 251.0 lb

## 2018-09-22 DIAGNOSIS — E559 Vitamin D deficiency, unspecified: Secondary | ICD-10-CM | POA: Diagnosis not present

## 2018-09-22 DIAGNOSIS — R7303 Prediabetes: Secondary | ICD-10-CM

## 2018-09-22 DIAGNOSIS — Z6837 Body mass index (BMI) 37.0-37.9, adult: Secondary | ICD-10-CM

## 2018-09-22 NOTE — Progress Notes (Signed)
Office: [phone]  /  Fax: [phone]   HPI:   Chief Complaint: OBESITY Jared Morse is here to discuss his progress with his obesity treatment plan. He is on the Category 4 plan and is following his eating plan approximately 75-80 % of the time. He states he is walking for 60 minutes 5 times per week. Jared Morse had a visit from his daughter and grandchildren, and he had some indulgences with some food from [place]. He had bagels, [employer] cheese cake, and [place] food. He denies hunger, but he is still having cravings at night. His weight is 251 lb (113.9 kg) today and has had a weight loss of 1 pound over a period of 2 to 3 weeks since his last visit. He has lost 15 lbs since starting treatment with [place].  Pre-Diabetes Jared Morse has a diagnosis of pre-diabetes based on his elevated Hgb A1c and was informed this puts him at greater risk of developing diabetes. He notes occasional carbohydrate cravings, and denies GI side effects of metformin. He continues to work on diet and exercise to decrease risk of diabetes. He denies hypoglycemia.  Vitamin D Deficiency Jared Morse has a diagnosis of vitamin D deficiency. He is currently taking prescription Vit D. He notes fatigue and denies nausea, vomiting or muscle weakness.  ASSESSMENT AND PLAN:  Prediabetes  Vitamin D deficiency  Class 2 severe obesity with serious comorbidity and body mass index (BMI) of 37.0 to 37.9 in adult, unspecified obesity type (HCC)  PLAN:  Pre-Diabetes Jared Morse will continue to work on weight loss, exercise, and decreasing simple carbohydrates in his diet to help decrease the risk of diabetes. We dicussed metformin including benefits and risks. He was informed that eating too many simple carbohydrates or too many calories at one sitting increases the likelihood of GI side effects. Dagan agrees to continue taking metformin, and he agrees to follow up with our clinic in 2 weeks as directed to monitor his progress.  Vitamin  D Deficiency Jared Morse was informed that low vitamin D levels contributes to fatigue and are associated with obesity, breast, and colon cancer. Jared Morse agrees to continue taking prescription Vit D @50 ,000 IU every week, no refill needed. He will follow up for routine testing of vitamin D, at least 2-3 times per year. He was informed of the risk of over-replacement of vitamin D and agrees to not increase his dose unless he discusses this with [place] first. Jared Morse agrees to follow up with our clinic in 2 weeks.  I spent > than 50% of the 15 minute visit on counseling as documented in the note.  Obesity Jared Morse is currently in the action stage of change. As such, his goal is to continue with weight loss efforts He has agreed to keep a food journal with 400-500 calories and 35+ grams of protein at breakfast daily and follow the Category 4 plan Jared Morse has been instructed to work up to a goal of 150 minutes of combined cardio and strengthening exercise per week for weight loss and overall health benefits. We discussed the following Behavioral Modification Strategies today: increasing lean protein intake, increasing vegetables and work on meal planning and easy cooking plans, and planning for success   Jared Morse has agreed to follow up with our clinic in 2 weeks. He was informed of the importance of frequent follow up visits to maximize his success with intensive lifestyle modifications for his multiple health conditions.  ALLERGIES: No Known Allergies  MEDICATIONS: Current Outpatient Medications on File Prior to  Visit  Medication Sig Dispense Refill  . aspirin 81 MG tablet Take 81 mg by mouth daily.    Marland Kitchen atorvastatin (LIPITOR) 80 MG tablet Take 80 mg by mouth daily.    . carvedilol (COREG) 12.5 MG tablet Take 1 tablet (12.5 mg total) by mouth 2 (two) times daily with a meal. 180 tablet 3  . metFORMIN (GLUCOPHAGE) 500 MG tablet Take 1 tablet (500 mg total) by mouth daily with breakfast. 30 tablet 0  .  nitroGLYCERIN (NITROSTAT) 0.4 MG SL tablet Place 0.4 mg under the tongue every 5 (five) minutes as needed for chest pain.  12  . Vitamin D, Ergocalciferol, (DRISDOL) 1.25 MG (50000 UT) CAPS capsule Take 1 capsule (50,000 Units total) by mouth every 7 (seven) days. 4 capsule 0   No current facility-administered medications on file prior to visit.     PAST MEDICAL HISTORY: Past Medical History:  Diagnosis Date  . Allergy   . Asthma   . CAD (coronary artery disease)    EF = 0.40%,mild AR,RVSP=31mmHG  ECHO 08/26/13  . Coronary artery disease    consistent with angina/ NON STEMI (non ST elevated myocardial infaraction)  . Heart disease   . History of chicken pox   . Hypertension   . Myocardial infarction (HCC) 07/2013  . Prediabetes   . Sleep apnea     PAST SURGICAL HISTORY: Past Surgical History:  Procedure Laterality Date  . CORONARY ANGIOPLASTY WITH STENT PLACEMENT  08/06/13   DES to RCA in setting of NSTEMI, 50-60 %LAD    SOCIAL HISTORY: Social History   Tobacco Use  . Smoking status: Never Smoker  . Smokeless tobacco: Never Used  Substance Use Topics  . Alcohol use: No  . Drug use: No    FAMILY HISTORY: Family History  Problem Relation Age of Onset  . Hypertension Mother   . Diabetes Mother   . Heart disease Mother   . Hyperlipidemia Mother   . Depression Mother   . Anxiety disorder Mother   . Obesity Mother   . COPD Father   . COPD Sister   . Stroke Sister     ROS: Review of Systems  Constitutional: Positive for malaise/fatigue and weight loss.  Gastrointestinal: Negative for nausea and vomiting.  Musculoskeletal:       Negative muscle weakness  Endo/Heme/Allergies:       Negative hypoglycemia    PHYSICAL EXAM: Blood pressure (!) 144/72, pulse 64, temperature 98.5 F (36.9 C), temperature source Oral, height 5\' 9"  (1.753 m), weight 251 lb (113.9 kg), SpO2 97 %. Body mass index is 37.07 kg/m. Physical Exam Vitals signs reviewed.  Constitutional:       Appearance: Normal appearance. He is obese.  Cardiovascular:     Rate and Rhythm: Normal rate.     Pulses: Normal pulses.  Pulmonary:     Effort: Pulmonary effort is normal.     Breath sounds: Normal breath sounds.  Musculoskeletal: Normal range of motion.  Skin:    General: Skin is warm and dry.  Neurological:     Mental Status: He is alert and oriented to person, place, and time.  Psychiatric:        Mood and Affect: Mood normal.        Behavior: Behavior normal.     RECENT LABS AND TESTS: BMET    Component Value Date/Time   NA 142 08/07/2018 1155   K 4.3 08/07/2018 1155   CL 106 08/07/2018 1155   CO2 20  08/07/2018 1155   GLUCOSE 91 08/07/2018 1155   GLUCOSE 124 (H) 09/22/2017 0227   BUN 16 08/07/2018 1155   CREATININE 0.87 08/07/2018 1155   CALCIUM 9.3 08/07/2018 1155   GFRNONAA 96 08/07/2018 1155   GFRAA 112 08/07/2018 1155   Lab Results  Component Value Date   HGBA1C 6.3 (H) 08/07/2018   Lab Results  Component Value Date   INSULIN 16.9 08/07/2018   CBC    Component Value Date/Time   WBC 7.0 08/07/2018 1155   WBC 8.9 09/21/2017 0836   RBC 4.96 08/07/2018 1155   RBC 4.53 09/21/2017 0836   HGB 14.2 08/07/2018 1155   HCT 41.7 08/07/2018 1155   PLT 306 09/21/2017 0836   MCV 84 08/07/2018 1155   MCH 28.6 08/07/2018 1155   MCH 28.9 09/21/2017 0836   MCHC 34.1 08/07/2018 1155   MCHC 33.2 09/21/2017 0836   RDW 14.5 08/07/2018 1155   LYMPHSABS 2.2 08/07/2018 1155   MONOABS 0.8 09/20/2017 0253   EOSABS 0.2 08/07/2018 1155   BASOSABS 0.0 08/07/2018 1155   Iron/TIBC/Ferritin/ %Sat No results found for: IRON, TIBC, FERRITIN, IRONPCTSAT Lipid Panel     Component Value Date/Time   CHOL 87 (L) 08/07/2018 1155   TRIG 126 08/07/2018 1155   HDL 24 (L) 08/07/2018 1155   LDLCALC 38 08/07/2018 1155   Hepatic Function Panel     Component Value Date/Time   PROT 7.1 08/07/2018 1155   ALBUMIN 4.2 08/07/2018 1155   AST 20 08/07/2018 1155   ALT 28  08/07/2018 1155   ALKPHOS 78 08/07/2018 1155   BILITOT 0.8 08/07/2018 1155      Component Value Date/Time   TSH 1.700 08/07/2018 1155   TSH 0.470 09/12/2017 0052      OBESITY BEHAVIORAL INTERVENTION VISIT  Today's visit was # 4   Starting weight: 266 lbs Starting date: 08/07/2018 Today's weight : 251 lbs  Today's date: 09/22/2018 Total lbs lost to date: 15    09/22/2018  Height 5\' 9"  (1.753 m)  Weight 251 lb (113.9 kg)  BMI (Calculated) 37.05  BLOOD PRESSURE - SYSTOLIC 144  BLOOD PRESSURE - DIASTOLIC 72   Body Fat % 35.2 %  Total Body Water (lbs) 116 lbs     ASK: We discussed the diagnosis of obesity with Jared Morse today and Jared Morse agreed to give [place] permission to discuss obesity behavioral modification therapy today.  ASSESS: Jared Morse has the diagnosis of obesity and his BMI today is 37.05 Jared Morse is in the action stage of change   ADVISE: Jared Morse was educated on the multiple health risks of obesity as well as the benefit of weight loss to improve his health. He was advised of the need for long term treatment and the importance of lifestyle modifications to improve his current health and to decrease his risk of future health problems.  AGREE: Multiple dietary modification options and treatment options were discussed and  Jared Morse agreed to follow the recommendations documented in the above note.  ARRANGE: Jared Morse was educated on the importance of frequent visits to treat obesity as outlined per CMS and USPSTF guidelines and agreed to schedule his next follow up appointment today.  I, Jared Morse, am acting as transcriptionist for Debbra Riding, MD  I have reviewed the above documentation for accuracy and completeness, and I agree with the above. - Debbra Riding, MD

## 2018-09-30 DIAGNOSIS — M545 Low back pain: Secondary | ICD-10-CM | POA: Diagnosis not present

## 2018-09-30 DIAGNOSIS — M5126 Other intervertebral disc displacement, lumbar region: Secondary | ICD-10-CM | POA: Diagnosis not present

## 2018-10-07 ENCOUNTER — Other Ambulatory Visit: Payer: Self-pay | Admitting: Family Medicine

## 2018-10-08 DIAGNOSIS — M5126 Other intervertebral disc displacement, lumbar region: Secondary | ICD-10-CM | POA: Diagnosis not present

## 2018-10-09 ENCOUNTER — Ambulatory Visit (INDEPENDENT_AMBULATORY_CARE_PROVIDER_SITE_OTHER): Payer: Federal, State, Local not specified - PPO | Admitting: Family Medicine

## 2018-10-09 ENCOUNTER — Encounter (INDEPENDENT_AMBULATORY_CARE_PROVIDER_SITE_OTHER): Payer: Self-pay

## 2018-10-15 ENCOUNTER — Other Ambulatory Visit (INDEPENDENT_AMBULATORY_CARE_PROVIDER_SITE_OTHER): Payer: Self-pay | Admitting: Family Medicine

## 2018-10-15 DIAGNOSIS — R7303 Prediabetes: Secondary | ICD-10-CM

## 2018-10-16 ENCOUNTER — Encounter (INDEPENDENT_AMBULATORY_CARE_PROVIDER_SITE_OTHER): Payer: Self-pay

## 2018-10-22 ENCOUNTER — Other Ambulatory Visit (INDEPENDENT_AMBULATORY_CARE_PROVIDER_SITE_OTHER): Payer: Self-pay | Admitting: Family Medicine

## 2018-10-22 ENCOUNTER — Encounter (INDEPENDENT_AMBULATORY_CARE_PROVIDER_SITE_OTHER): Payer: Self-pay

## 2018-10-22 ENCOUNTER — Encounter: Payer: Self-pay | Admitting: Family Medicine

## 2018-10-22 DIAGNOSIS — R7303 Prediabetes: Secondary | ICD-10-CM

## 2018-10-24 ENCOUNTER — Encounter: Payer: Self-pay | Admitting: Family Medicine

## 2018-10-24 ENCOUNTER — Other Ambulatory Visit: Payer: Self-pay

## 2018-10-24 ENCOUNTER — Ambulatory Visit (INDEPENDENT_AMBULATORY_CARE_PROVIDER_SITE_OTHER): Payer: Federal, State, Local not specified - PPO | Admitting: Family Medicine

## 2018-10-24 DIAGNOSIS — I2510 Atherosclerotic heart disease of native coronary artery without angina pectoris: Secondary | ICD-10-CM | POA: Diagnosis not present

## 2018-10-24 DIAGNOSIS — I2583 Coronary atherosclerosis due to lipid rich plaque: Secondary | ICD-10-CM

## 2018-10-24 DIAGNOSIS — I10 Essential (primary) hypertension: Secondary | ICD-10-CM

## 2018-10-24 DIAGNOSIS — R7303 Prediabetes: Secondary | ICD-10-CM

## 2018-10-24 MED ORDER — CARVEDILOL 12.5 MG PO TABS: 12.5000 mg | ORAL_TABLET | Freq: Two times a day (BID) | ORAL | 2 refills | Status: DC

## 2018-10-24 MED ORDER — ATORVASTATIN CALCIUM 80 MG PO TABS: 80.0000 mg | ORAL_TABLET | Freq: Every day | ORAL | 2 refills | Status: DC

## 2018-10-24 MED ORDER — NITROGLYCERIN 0.4 MG SL SUBL: 0.4000 mg | SUBLINGUAL_TABLET | SUBLINGUAL | 6 refills | Status: AC | PRN

## 2018-10-24 MED ORDER — METFORMIN HCL 500 MG PO TABS: 500.0000 mg | ORAL_TABLET | Freq: Every day | ORAL | 1 refills | Status: DC

## 2018-10-24 NOTE — Progress Notes (Signed)
Virtual Visit via Video Note  I connected with [name] on 10/24/18 at 11:15 AM EDT by a video enabled telemedicine application and verified that I am speaking with the correct person using two identifiers.  Location patient: home Location provider:home office Persons participating in the virtual visit: patient, provider  I discussed the limitations of evaluation and management by telemedicine and the availability of in person appointments. The patient expressed understanding and agreed to proceed.   HPI: [name] is a 58 yo male with Hx of CAD,asthma, HTN,and prediabetes, who is requesting refills on Metformin.  According to pt,Metformin was prescribed for wt loss Denies abdominal pain, nausea,vomiting, polydipsia,polyuria, or polyphagia.   Lab Results  Component Value Date   HGBA1C 6.3 (H) 08/07/2018    He is losing wt,states that he was doing so before Metformin was started.He is not sure if medication has helped. Tolerating med well,no side effects reported.   Also requesting refills on Coreg and Nitroglycerin.  HTN and SVT on Coreg 12.5 mg bid. BP's 130's/70's.  Denies severe/frequent headache, visual changes, chest pain, dyspnea, palpitation, claudication, focal weakness, or edema.  Lab Results  Component Value Date   CREATININE 0.87 08/07/2018   BUN 16 08/07/2018   NA 142 08/07/2018   K 4.3 08/07/2018   CL 106 08/07/2018   CO2 20 08/07/2018   Negative for gross hematuria,decreased urine output,or foam in urine.  CAD,follows with Dr Eden Emms. Last OV 10/2017.  Echo in 09/2017:LVEF 55-60%  HLD,he is on Lipitor 80 mg daily. He follows a low fat diet.  Lab Results  Component Value Date   CHOL 87 (L) 08/07/2018   HDL 24 (L) 08/07/2018   LDLCALC 38 08/07/2018   TRIG 126 08/07/2018     ROS: See pertinent positives and negatives per HPI. COVID-19 screening questions: Denies new fever,cough,sore throat,or possible exposure to COVID-19. Denies changes in small  or taste.  Past Medical History:  Diagnosis Date  . Allergy   . Asthma   . CAD (coronary artery disease)    EF = 0.40%,mild AR,RVSP=34mmHG  ECHO 08/26/13  . Coronary artery disease    consistent with angina/ NON STEMI (non ST elevated myocardial infaraction)  . Heart disease   . History of chicken pox   . Hypertension   . Myocardial infarction (HCC) 07/2013  . Prediabetes   . Sleep apnea     Past Surgical History:  Procedure Laterality Date  . CORONARY ANGIOPLASTY WITH STENT PLACEMENT  08/06/13   DES to RCA in setting of NSTEMI, 50-60 %LAD    Family History  Problem Relation Age of Onset  . Hypertension Mother   . Diabetes Mother   . Heart disease Mother   . Hyperlipidemia Mother   . Depression Mother   . Anxiety disorder Mother   . Obesity Mother   . COPD Father   . COPD Sister   . Stroke Sister    Social History   Socioeconomic History  . Marital status: Married    Spouse name: Alba  . Number of children: 2  . Years of education: Not on file  . Highest education level: Not on file  Occupational History  . Not on file  Social Needs  . Financial resource strain: Not on file  . Food insecurity:    Worry: Not on file    Inability: Not on file  . Transportation needs:    Medical: Not on file    Non-medical: Not on file  Tobacco Use  .  Smoking status: Never Smoker  . Smokeless tobacco: Never Used  Substance and Sexual Activity  . Alcohol use: No  . Drug use: No  . Sexual activity: Yes    Partners: Female  Lifestyle  . Physical activity:    Days per week: Not on file    Minutes per session: Not on file  . Stress: Not on file  Relationships  . Social connections:    Talks on phone: Not on file    Gets together: Not on file    Attends religious service: Not on file    Active member of club or organization: Not on file    Attends meetings of clubs or organizations: Not on file    Relationship status: Not on file  . Intimate partner violence:    Fear  of current or ex partner: Not on file    Emotionally abused: Not on file    Physically abused: Not on file    Forced sexual activity: Not on file  Other Topics Concern  . Not on file  Social History Narrative  . Not on file     Current Outpatient Medications:  .  aspirin 81 MG tablet, Take 81 mg by mouth daily., Disp: , Rfl:  .  atorvastatin (LIPITOR) 80 MG tablet, Take 1 tablet (80 mg total) by mouth daily., Disp: 90 tablet, Rfl: 2 .  carvedilol (COREG) 12.5 MG tablet, Take 1 tablet (12.5 mg total) by mouth 2 (two) times daily., Disp: 180 tablet, Rfl: 2 .  metFORMIN (GLUCOPHAGE) 500 MG tablet, Take 1 tablet (500 mg total) by mouth daily with breakfast., Disp: 90 tablet, Rfl: 1 .  nitroGLYCERIN (NITROSTAT) 0.4 MG SL tablet, Place 1 tablet (0.4 mg total) under the tongue every 5 (five) minutes as needed for chest pain., Disp: 10 tablet, Rfl: 6 .  Vitamin D, Ergocalciferol, (DRISDOL) 1.25 MG (50000 UT) CAPS capsule, Take 1 capsule (50,000 Units total) by mouth every 7 (seven) days., Disp: 4 capsule, Rfl: 0  EXAM:  VITALS per patient if applicable:BP 137/75   Pulse 76   Resp 12   SpO2 96%   GENERAL: alert, oriented, appears well and in no acute distress  HEENT: atraumatic, conjunctiva clear, no obvious abnormalities on inspection of face.  NECK: normal movements of the head and neck  LUNGS: on inspection no signs of respiratory distress, breathing rate appears normal, no obvious gross SOB, gasping or wheezing  CV: no obvious cyanosis  MS: moves all visible extremities without noticeable abnormality  PSYCH/NEURO: pleasant and cooperative, no obvious depression,+ anxious.Speech and thought processing grossly intact  ASSESSMENT AND PLAN:  Discussed the following assessment and plan:   CAD (coronary artery disease) Asymptomatic. Nitro sent to his pharmacy. Continue Aspirin 81 mg and statin.   Benign essential hypertension Adequately controlled. No changes in current  management. Continue low salt diet. Eye exam recommended annually.    Prediabetes Continue healthy life style for primary prevention. Metformin side effects discussed.   Obesity, morbid, BMI 40.0-49.9 (HCC) Reporting wt loss through a healthful diet and regular physical activity.Encouraged to continue. He is not sure if Metformin has helped,recommed continuing for now and to arrange a f/u appt with wt clinic.      I discussed the assessment and treatment plan with the patient. The patient was provided an opportunity to ask questions and all were answered. The patient agreed with the plan and demonstrated an understanding of the instructions.     Return in about 6 months (around  04/25/2019).    Liban Guedes [place], MD

## 2018-10-24 NOTE — Telephone Encounter (Signed)
Virtual visit completed. Metformin refilled.  Jared Coor [place], MD

## 2018-10-24 NOTE — Assessment & Plan Note (Signed)
Adequately controlled. No changes in current management. Continue low salt diet. Eye exam recommended annually. 

## 2018-10-24 NOTE — Telephone Encounter (Signed)
We did a virtual visit. Jared Salminen [place], MD

## 2018-10-24 NOTE — Assessment & Plan Note (Signed)
Reporting wt loss through a healthful diet and regular physical activity.Encouraged to continue. He is not sure if Metformin has helped,recommed continuing for now and to arrange a f/u appt with wt clinic.

## 2018-10-24 NOTE — Assessment & Plan Note (Signed)
Asymptomatic. Nitro sent to his pharmacy. Continue Aspirin 81 mg and statin.

## 2018-10-24 NOTE — Assessment & Plan Note (Signed)
Continue healthy life style for primary prevention. Metformin side effects discussed.

## 2018-11-19 ENCOUNTER — Other Ambulatory Visit: Payer: Self-pay

## 2018-11-19 ENCOUNTER — Encounter: Payer: Self-pay | Admitting: Family Medicine

## 2018-11-19 ENCOUNTER — Ambulatory Visit (INDEPENDENT_AMBULATORY_CARE_PROVIDER_SITE_OTHER): Payer: Federal, State, Local not specified - PPO | Admitting: Family Medicine

## 2018-11-19 VITALS — Resp 12

## 2018-11-19 DIAGNOSIS — H1033 Unspecified acute conjunctivitis, bilateral: Secondary | ICD-10-CM

## 2018-11-19 DIAGNOSIS — G4733 Obstructive sleep apnea (adult) (pediatric): Secondary | ICD-10-CM | POA: Diagnosis not present

## 2018-11-19 DIAGNOSIS — J9601 Acute respiratory failure with hypoxia: Secondary | ICD-10-CM | POA: Diagnosis not present

## 2018-11-19 MED ORDER — OLOPATADINE HCL 0.7 % OP SOLN: 1.0000 [drp] | Freq: Every day | OPHTHALMIC | 2 refills | Status: DC | PRN

## 2018-11-19 MED ORDER — CIPROFLOXACIN HCL 0.3 % OP SOLN: 1.0000 [drp] | OPHTHALMIC | 0 refills | Status: AC

## 2018-11-19 NOTE — Progress Notes (Signed)
Virtual Visit via Video Note   I connected with Jared Morse on 11/19/18 at  2:45 PM EDT by a video enabled telemedicine application and verified that I am speaking with the correct person using two identifiers.  Location patient: home Location provider:home office Persons participating in the virtual visit: patient, provider  I discussed the limitations of evaluation and management by telemedicine and the availability of in person appointments. The patient expressed understanding and agreed to proceed.   HPI: Jared Morse is a 58 yo male with Hx of allergies and asthma concerned about a day of bilateral conjunctival erythema, started with right eye. Problem is constant, he has not identified exacerbating or alleviating factors.  "Little itching" and burning sensation, no purulent drainage. He has not tried OTC medication. Mild frontal headache.  Denies associated fever, chills, visual changes, eye pain, nausea, or vomiting. Denies sick contact. Problem has been stable.  He wears contacts.  Mild nasal congestion and rhinorrhea for the past 2 weeks. He is taking OTC antihistaminic. He denies cough, wheezing, or dyspnea.  ROS: See pertinent positives and negatives per HPI. COVID-19 screening questions: Denies new fever,cough,sore throat,or possible exposure to COVID-19. Negative for loss in the sense of smell or taste.   Past Medical History:  Diagnosis Date  . Allergy   . Asthma   . CAD (coronary artery disease)    EF = 0.40%,mild AR,RVSP=19mmHG  ECHO 08/26/13  . Coronary artery disease    consistent with angina/ NON STEMI (non ST elevated myocardial infaraction)  . Heart disease   . History of chicken pox   . Hypertension   . Myocardial infarction (HCC) 07/2013  . Prediabetes   . Sleep apnea     Past Surgical History:  Procedure Laterality Date  . CORONARY ANGIOPLASTY WITH STENT PLACEMENT  08/06/13   DES to RCA in setting of NSTEMI, 50-60 %LAD    Family History   Problem Relation Age of Onset  . Hypertension Mother   . Diabetes Mother   . Heart disease Mother   . Hyperlipidemia Mother   . Depression Mother   . Anxiety disorder Mother   . Obesity Mother   . COPD Father   . COPD Sister   . Stroke Sister     Social History   Socioeconomic History  . Marital status: Married    Spouse name: Jared Morse  . Number of children: 2  . Years of education: Not on file  . Highest education level: Not on file  Occupational History  . Not on file  Social Needs  . Financial resource strain: Not on file  . Food insecurity:    Worry: Not on file    Inability: Not on file  . Transportation needs:    Medical: Not on file    Non-medical: Not on file  Tobacco Use  . Smoking status: Never Smoker  . Smokeless tobacco: Never Used  Substance and Sexual Activity  . Alcohol use: No  . Drug use: No  . Sexual activity: Yes    Partners: Female  Lifestyle  . Physical activity:    Days per week: Not on file    Minutes per session: Not on file  . Stress: Not on file  Relationships  . Social connections:    Talks on phone: Not on file    Gets together: Not on file    Attends religious service: Not on file    Active member of club or organization: Not on file  Attends meetings of clubs or organizations: Not on file    Relationship status: Not on file  . Intimate partner violence:    Fear of current or ex partner: Not on file    Emotionally abused: Not on file    Physically abused: Not on file    Forced sexual activity: Not on file  Other Topics Concern  . Not on file  Social History Narrative  . Not on file      Current Outpatient Medications:  .  aspirin 81 MG tablet, Take 81 mg by mouth daily., Disp: , Rfl:  .  atorvastatin (LIPITOR) 80 MG tablet, Take 1 tablet (80 mg total) by mouth daily., Disp: 90 tablet, Rfl: 2 .  carvedilol (COREG) 12.5 MG tablet, Take 1 tablet (12.5 mg total) by mouth 2 (two) times daily., Disp: 180 tablet, Rfl: 2 .   ciprofloxacin (CILOXAN) 0.3 % ophthalmic solution, Place 1 drop into both eyes every 4 (four) hours while awake for 7 days. Administer 1 drop, every 2 hours, while awake, for 2 days. Then 1 drop, every 4 hours, while awake, for the next 5 days., Disp: 5 mL, Rfl: 0 .  metFORMIN (GLUCOPHAGE) 500 MG tablet, Take 1 tablet (500 mg total) by mouth daily with breakfast., Disp: 90 tablet, Rfl: 1 .  nitroGLYCERIN (NITROSTAT) 0.4 MG SL tablet, Place 1 tablet (0.4 mg total) under the tongue every 5 (five) minutes as needed for chest pain., Disp: 10 tablet, Rfl: 6 .  Olopatadine HCl (PAZEO) 0.7 % SOLN, Apply 1 drop to eye daily as needed., Disp: 1 Bottle, Rfl: 2 .  Vitamin D, Ergocalciferol, (DRISDOL) 1.25 MG (50000 UT) CAPS capsule, Take 1 capsule (50,000 Units total) by mouth every 7 (seven) days., Disp: 4 capsule, Rfl: 0  EXAM:  VITALS per patient if applicable:Resp 12   GENERAL: alert, oriented, appears well and in no acute distress  HEENT: atraumatic, mild conjunctivae erythema,R>L,no discharge.EOM intact. No obvious facial abnormalities on inspection.  NECK: normal movements of the head and neck  LUNGS: on inspection no signs of respiratory distress, breathing rate appears normal, no obvious gross SOB, gasping or wheezing  PSYCH/NEURO: pleasant and cooperative, no obvious depression or anxiety, speech and thought processing grossly intact  ASSESSMENT AND PLAN:  Discussed the following assessment and plan:  Acute conjunctivitis of both eyes, unspecified acute conjunctivitis type - Plan: ciprofloxacin (CILOXAN) 0.3 % ophthalmic solution, Olopatadine HCl (PAZEO) 0.7 % SOLN  Explained that history suggest allergic etiology. He is very concerned about bacterial conjunctivitis, so topical antibiotics are recommended. Ciprofloxacin eyedrops x7 days. I also recommended Pazeo eyedrops to use daily as needed. He was clearly instructed about warning signs. Follow-up as needed.   I discussed the  assessment and treatment plan with the patient. He was provided an opportunity to ask questions and all were answered. The patient agreed with the plan and demonstrated an understanding of the instructions.   The patient was advised to call back or seek an in-person evaluation if the symptoms worsen or if the condition fails to improve as anticipated.  Return if symptoms worsen or fail to improve.    Dala Breault [place], MD

## 2018-12-02 ENCOUNTER — Other Ambulatory Visit: Payer: Self-pay | Admitting: Family Medicine

## 2018-12-02 DIAGNOSIS — H6092 Unspecified otitis externa, left ear: Secondary | ICD-10-CM

## 2019-04-19 ENCOUNTER — Other Ambulatory Visit: Payer: Self-pay | Admitting: Family Medicine

## 2019-04-19 DIAGNOSIS — R7303 Prediabetes: Secondary | ICD-10-CM

## 2019-07-08 ENCOUNTER — Telehealth (INDEPENDENT_AMBULATORY_CARE_PROVIDER_SITE_OTHER): Payer: Federal, State, Local not specified - PPO | Admitting: Family Medicine

## 2019-07-08 ENCOUNTER — Encounter: Payer: Self-pay | Admitting: Family Medicine

## 2019-07-08 DIAGNOSIS — H1013 Acute atopic conjunctivitis, bilateral: Secondary | ICD-10-CM | POA: Diagnosis not present

## 2019-07-08 DIAGNOSIS — H109 Unspecified conjunctivitis: Secondary | ICD-10-CM | POA: Diagnosis not present

## 2019-07-08 MED ORDER — NEOMYCIN-POLYMYXIN-DEXAMETH 3.5-10000-0.1 OP OINT: 1.0000 "application " | TOPICAL_OINTMENT | Freq: Every day | OPHTHALMIC | 0 refills | Status: AC

## 2019-07-08 MED ORDER — PAZEO 0.7 % OP SOLN: 1.0000 [drp] | Freq: Every day | OPHTHALMIC | 4 refills | Status: AC | PRN

## 2019-07-08 MED ORDER — NEOMYCIN-POLYMYXIN-HC 3.5-10000-1 OP SUSP: 2.0000 [drp] | Freq: Three times a day (TID) | OPHTHALMIC | 0 refills | Status: DC

## 2019-07-08 NOTE — Progress Notes (Signed)
Virtual Visit via Video Note   I connected with Jared Morse on 07/08/19 by a video enabled telemedicine application and verified that I am speaking with the correct person using two identifiers.  Location patient: home Location provider:work office Persons participating in the virtual visit: patient, provider  I discussed the limitations of evaluation and management by telemedicine and the availability of in person appointments. The patient expressed understanding and agreed to proceed.   HPI: Jared Morse 58 yo male with Hx of asthma,CAD, and allergies c/o bilateral, R>L, conjunctival erythema, mild pruritus,and epiphora. No purulent drainage. Attributed to wearing contact lenses for longer than usual. No hx of trauma. "Little" eye pain, right.  Negative for visual changes or photophobia. He has not noted fever, chills, headache, sore throat, nausea, or vomiting. Problem is stable. He has not used OTC medication.   ROS: See pertinent positives and negatives per HPI.  Past Medical History:  Diagnosis Date  . Allergy   . Asthma   . CAD (coronary artery disease)    EF = 0.40%,mild AR,RVSP=55mmHG  ECHO 08/26/13  . Coronary artery disease    consistent with angina/ NON STEMI (non ST elevated myocardial infaraction)  . Heart disease   . History of chicken pox   . Hypertension   . Myocardial infarction ([place]) 07/2013  . Prediabetes   . Sleep apnea     Past Surgical History:  Procedure Laterality Date  . CORONARY ANGIOPLASTY WITH STENT PLACEMENT  08/06/13   DES to RCA in setting of NSTEMI, 50-60 %LAD    Family History  Problem Relation Age of Onset  . Hypertension Mother   . Diabetes Mother   . Heart disease Mother   . Hyperlipidemia Mother   . Depression Mother   . Anxiety disorder Mother   . Obesity Mother   . COPD Father   . COPD Sister   . Stroke Sister     Social History   Socioeconomic History  . Marital status: Married    Spouse name: Alba  . Number of  children: 2  . Years of education: Not on file  . Highest education level: Not on file  Occupational History  . Not on file  Tobacco Use  . Smoking status: Never Smoker  . Smokeless tobacco: Never Used  Substance and Sexual Activity  . Alcohol use: No  . Drug use: No  . Sexual activity: Yes    Partners: Female  Other Topics Concern  . Not on file  Social History Narrative  . Not on file   Social Determinants of Health   Financial Resource Strain:   . Difficulty of Paying Living Expenses: Not on file  Food Insecurity:   . Worried About Charity fundraiser in the Last Year: Not on file  . Ran Out of Food in the Last Year: Not on file  Transportation Needs:   . Lack of Transportation (Medical): Not on file  . Lack of Transportation (Non-Medical): Not on file  Physical Activity:   . Days of Exercise per Week: Not on file  . Minutes of Exercise per Session: Not on file  Stress:   . Feeling of Stress : Not on file  Social Connections:   . Frequency of Communication with Friends and Family: Not on file  . Frequency of Social Gatherings with Friends and Family: Not on file  . Attends Religious Services: Not on file  . Active Member of Clubs or Organizations: Not on file  . Attends Club  or Organization Meetings: Not on file  . Marital Status: Not on file  Intimate Partner Violence:   . Fear of Current or Ex-Partner: Not on file  . Emotionally Abused: Not on file  . Physically Abused: Not on file  . Sexually Abused: Not on file    Current Outpatient Medications:  .  aspirin 81 MG tablet, Take 81 mg by mouth daily., Disp: , Rfl:  .  atorvastatin (LIPITOR) 80 MG tablet, Take 1 tablet (80 mg total) by mouth daily., Disp: 90 tablet, Rfl: 2 .  carvedilol (COREG) 12.5 MG tablet, Take 1 tablet (12.5 mg total) by mouth 2 (two) times daily., Disp: 180 tablet, Rfl: 2 .  metFORMIN (GLUCOPHAGE) 500 MG tablet, TAKE 1 TABLET BY MOUTH EVERY DAY WITH BREAKFAST, Disp: 90 tablet, Rfl: 1 .   nitroGLYCERIN (NITROSTAT) 0.4 MG SL tablet, Place 1 tablet (0.4 mg total) under the tongue every 5 (five) minutes as needed for chest pain., Disp: 10 tablet, Rfl: 6 .  Olopatadine HCl (PAZEO) 0.7 % SOLN, Apply 1 drop to eye daily as needed., Disp: 2.5 mL, Rfl: 4 .  Vitamin D, Ergocalciferol, (DRISDOL) 1.25 MG (50000 UT) CAPS capsule, Take 1 capsule (50,000 Units total) by mouth every 7 (seven) days., Disp: 4 capsule, Rfl: 0 .  neomycin-polymyxin-hydrocortisone (CORTISPORIN) 3.5-10000-1 ophthalmic suspension, Place 2 drops into the right eye 3 (three) times daily for 7 days., Disp: 7.5 mL, Rfl: 0  EXAM:  VITALS per patient if applicable:N/A  GENERAL: alert, oriented, appears well and in no acute distress  HEENT: atraumatic normocephalic, right mild conjunctival erythema, left conjunctivae is clear,EOM intact bilateral. I do not appreciate epiphora or purulent drainage. No obvious abnormalities on inspection of external nose and ears  LUNGS: on inspection no signs of respiratory distress, breathing rate appears normal, no obvious gross SOB, gasping or wheezing  CV: no obvious cyanosis  PSYCH/NEURO: pleasant and cooperative, no obvious depression or anxiety, speech and thought processing grossly intact  ASSESSMENT AND PLAN:  Discussed the following assessment and plan:  Conjunctivitis, unspecified conjunctivitis type, unspecified laterality - Plan: neomycin-polymyxin-hydrocortisone (CORTISPORIN) 3.5-10000-1 ophthalmic suspension  Acute conjunctivitis of both eyes, unspecified acute conjunctivitis type - Plan: Olopatadine HCl (PAZEO) 0.7 % SOLN We discussed possible etiologies. Even though it is difficult to evaluate remotely, It does not seem to be bacterial. Viral vs allergic. Hx does not suggest a more serious process like uveitis or keratitis. I recommend topical steroid to help with inflammation, tid x 7 days Instructed about warning signs.  Pazeo 1 drop daily also recommended to  cover for allergic etiology.  I discussed the assessment and treatment plan with the patient. He was provided an opportunity to ask questions and all were answered. He agreed with the plan and demonstrated an understanding of the instructions.    Return if symptoms worsen or fail to improve.    Topaz Raglin [place], MD

## 2019-07-08 NOTE — Addendum Note (Signed)
Addended by: [place], Amor Hyle G on: 07/08/2019 06:28 PM   Modules accepted: Orders

## 2019-07-10 ENCOUNTER — Ambulatory Visit: Payer: Federal, State, Local not specified - PPO | Admitting: Family Medicine

## 2019-07-22 ENCOUNTER — Encounter: Payer: Self-pay | Admitting: Family Medicine

## 2019-07-22 ENCOUNTER — Ambulatory Visit: Payer: Federal, State, Local not specified - PPO | Attending: Internal Medicine

## 2019-07-22 DIAGNOSIS — Z20822 Contact with and (suspected) exposure to covid-19: Secondary | ICD-10-CM

## 2019-07-22 DIAGNOSIS — Z20828 Contact with and (suspected) exposure to other viral communicable diseases: Secondary | ICD-10-CM | POA: Diagnosis not present

## 2019-07-23 LAB — NOVEL CORONAVIRUS, NAA: SARS-CoV-2, NAA: NOT DETECTED

## 2019-07-29 ENCOUNTER — Encounter: Payer: Self-pay | Admitting: Family Medicine

## 2019-07-29 ENCOUNTER — Telehealth (INDEPENDENT_AMBULATORY_CARE_PROVIDER_SITE_OTHER): Payer: Federal, State, Local not specified - PPO | Admitting: Family Medicine

## 2019-07-29 DIAGNOSIS — Z20822 Contact with and (suspected) exposure to covid-19: Secondary | ICD-10-CM

## 2019-07-29 DIAGNOSIS — Z7189 Other specified counseling: Secondary | ICD-10-CM

## 2019-07-29 NOTE — Progress Notes (Signed)
Virtual Visit via Video Note   I connected with Jared Morse on 07/29/19 by a video enabled telemedicine application and verified that I am speaking with the correct person using two identifiers.  Location patient: home Location provider:work office Persons participating in the virtual visit: patient, provider  I discussed the limitations of evaluation and management by telemedicine and the availability of in person appointments. The patient expressed understanding and agreed to proceed.   HPI: Jared Morse is a 59 yo male with Hx of asthma,HTN,CAD,and allergies who recently was in contact with COVID-19 infected person. His daughter was dx'ed with COVID 19 infection about 1-2 weeks ago, she is now in quarantine.She is not longer febrile and smell/taste present.  He was having some congestion earlier this week but now he is feeling better. Recently screened with his wife,both test negative. No fever,chills,sore throat, changes in smell or taste, cough,wheezing,SOB,or GI symptoms.  His wife has some concerns. She is worried about her daughter having CV complications, states that she has heard about pts having MI after recovering from COVID 19 infections. Also wonders if daughter needs to be tested again. Wife is having some nasal congestion,attributed to possible allergies. No changes in smell or taste. She also had a negative COVID 19 test.   ROS: See pertinent positives and negatives per HPI.  Past Medical History:  Diagnosis Date  . Allergy   . Asthma   . CAD (coronary artery disease)    EF = 0.40%,mild AR,RVSP=56mmHG  ECHO 08/26/13  . Coronary artery disease    consistent with angina/ NON STEMI (non ST elevated myocardial infaraction)  . Heart disease   . History of chicken pox   . Hypertension   . Myocardial infarction (HCC) 07/2013  . Prediabetes   . Sleep apnea     Past Surgical History:  Procedure Laterality Date  . CORONARY ANGIOPLASTY WITH STENT PLACEMENT  08/06/13   DES to  RCA in setting of NSTEMI, 50-60 %LAD    Family History  Problem Relation Age of Onset  . Hypertension Mother   . Diabetes Mother   . Heart disease Mother   . Hyperlipidemia Mother   . Depression Mother   . Anxiety disorder Mother   . Obesity Mother   . COPD Father   . COPD Sister   . Stroke Sister     Social History   Socioeconomic History  . Marital status: Married    Spouse name: Alba  . Number of children: 2  . Years of education: Not on file  . Highest education level: Not on file  Occupational History  . Not on file  Tobacco Use  . Smoking status: Never Smoker  . Smokeless tobacco: Never Used  Substance and Sexual Activity  . Alcohol use: No  . Drug use: No  . Sexual activity: Yes    Partners: Female  Other Topics Concern  . Not on file  Social History Narrative  . Not on file   Social Determinants of Health   Financial Resource Strain:   . Difficulty of Paying Living Expenses: Not on file  Food Insecurity:   . Worried About Programme researcher, broadcasting/film/video in the Last Year: Not on file  . Ran Out of Food in the Last Year: Not on file  Transportation Needs:   . Lack of Transportation (Medical): Not on file  . Lack of Transportation (Non-Medical): Not on file  Physical Activity:   . Days of Exercise per Week: Not on file  .  Minutes of Exercise per Session: Not on file  Stress:   . Feeling of Stress : Not on file  Social Connections:   . Frequency of Communication with Friends and Family: Not on file  . Frequency of Social Gatherings with Friends and Family: Not on file  . Attends Religious Services: Not on file  . Active Member of Clubs or Organizations: Not on file  . Attends Archivist Meetings: Not on file  . Marital Status: Not on file  Intimate Partner Violence:   . Fear of Current or Ex-Partner: Not on file  . Emotionally Abused: Not on file  . Physically Abused: Not on file  . Sexually Abused: Not on file     Current Outpatient  Medications:  .  aspirin 81 MG tablet, Take 81 mg by mouth daily., Disp: , Rfl:  .  atorvastatin (LIPITOR) 80 MG tablet, Take 1 tablet (80 mg total) by mouth daily., Disp: 90 tablet, Rfl: 2 .  carvedilol (COREG) 12.5 MG tablet, Take 1 tablet (12.5 mg total) by mouth 2 (two) times daily., Disp: 180 tablet, Rfl: 2 .  metFORMIN (GLUCOPHAGE) 500 MG tablet, TAKE 1 TABLET BY MOUTH EVERY DAY WITH BREAKFAST, Disp: 90 tablet, Rfl: 1 .  nitroGLYCERIN (NITROSTAT) 0.4 MG SL tablet, Place 1 tablet (0.4 mg total) under the tongue every 5 (five) minutes as needed for chest pain., Disp: 10 tablet, Rfl: 6 .  Olopatadine HCl (PAZEO) 0.7 % SOLN, Apply 1 drop to eye daily as needed., Disp: 2.5 mL, Rfl: 4 .  Vitamin D, Ergocalciferol, (DRISDOL) 1.25 MG (50000 UT) CAPS capsule, Take 1 capsule (50,000 Units total) by mouth every 7 (seven) days., Disp: 4 capsule, Rfl: 0  EXAM:  VITALS per patient if applicable:N/A  GENERAL: alert, oriented, appears well and in no acute distress  HEENT: atraumatic, conjunctiva clear, no obvious abnormalities on inspection.  LUNGS: on inspection no signs of respiratory distress, breathing rate appears normal, no obvious gross SOB, gasping or wheezing  CV: no obvious cyanosis  PSYCH/NEURO: pleasant and cooperative, no obvious depression or anxiety, speech and thought processing grossly intact  ASSESSMENT AND PLAN:  Discussed the following assessment and plan:  Close exposure to COVID-19 virus  Educated about COVID-19 virus infection  Daughter seems to be recovering well,most symptoms have resolved. I do not think she needs to be re-tested unless she develops fever or symptoms re-occur or if it is needed in order to go back to school. Parents with negative COVID 19 test. The probability of daughter having CV complications at this time is very low.  Jared Morse is asymptomatic, so I do not think he needs to be re-tested. His wife is having some congestion and not feeling  well.Explained that test can be false negative,so she can be re-tested and monitor for new symptoms.  Recommend for pt to compete 7-10 days of quarantine.   I discussed the assessment and treatment plan with the patient. Jared Morse and his wife were provided an opportunity to ask questions and all were answered. They agreed with the plan and demonstrated an understanding of the instructions.    Return if symptoms worsen or fail to improve.    Paschal Blanton Martinique, MD

## 2019-08-02 ENCOUNTER — Encounter: Payer: Self-pay | Admitting: Family Medicine

## 2019-10-01 ENCOUNTER — Other Ambulatory Visit: Payer: Self-pay | Admitting: Family Medicine

## 2019-11-04 ENCOUNTER — Other Ambulatory Visit: Payer: Self-pay | Admitting: Family Medicine

## 2019-11-04 DIAGNOSIS — R7303 Prediabetes: Secondary | ICD-10-CM

## 2019-11-05 ENCOUNTER — Other Ambulatory Visit: Payer: Self-pay | Admitting: Family Medicine

## 2019-11-11 ENCOUNTER — Telehealth: Payer: Self-pay | Admitting: *Deleted

## 2019-11-11 ENCOUNTER — Telehealth: Payer: Federal, State, Local not specified - PPO | Admitting: Family Medicine

## 2019-11-11 ENCOUNTER — Ambulatory Visit: Payer: Federal, State, Local not specified - PPO | Admitting: Family Medicine

## 2019-11-11 NOTE — Telephone Encounter (Signed)
Tried calling patient twice to go over information before appointment. Patient did not answer. Left vm to return call to office. Tried calling patient a 3rd time and he stated that he left his phone at home and that the reason for the appointment was sinus congestion, but it has cleared up so he doesn't need the appointment now. Patient stated to cancel the appointment.

## 2019-12-03 ENCOUNTER — Other Ambulatory Visit: Payer: Self-pay | Admitting: Family Medicine

## 2019-12-03 DIAGNOSIS — R7303 Prediabetes: Secondary | ICD-10-CM

## 2019-12-07 NOTE — Telephone Encounter (Signed)
Patient need to schedule an ov for more refills. 

## 2019-12-08 ENCOUNTER — Other Ambulatory Visit: Payer: Self-pay | Admitting: Family Medicine

## 2019-12-08 DIAGNOSIS — R7303 Prediabetes: Secondary | ICD-10-CM

## 2019-12-09 ENCOUNTER — Encounter: Payer: Self-pay | Admitting: Family Medicine

## 2019-12-09 ENCOUNTER — Telehealth (INDEPENDENT_AMBULATORY_CARE_PROVIDER_SITE_OTHER): Payer: Federal, State, Local not specified - PPO | Admitting: Family Medicine

## 2019-12-09 DIAGNOSIS — I2510 Atherosclerotic heart disease of native coronary artery without angina pectoris: Secondary | ICD-10-CM | POA: Diagnosis not present

## 2019-12-09 DIAGNOSIS — I10 Essential (primary) hypertension: Secondary | ICD-10-CM

## 2019-12-09 DIAGNOSIS — I2583 Coronary atherosclerosis due to lipid rich plaque: Secondary | ICD-10-CM | POA: Diagnosis not present

## 2019-12-09 MED ORDER — ATORVASTATIN CALCIUM 80 MG PO TABS: 80.0000 mg | ORAL_TABLET | Freq: Every day | ORAL | 2 refills | Status: DC

## 2019-12-09 MED ORDER — CARVEDILOL 12.5 MG PO TABS: 12.5000 mg | ORAL_TABLET | Freq: Two times a day (BID) | ORAL | 2 refills | Status: DC

## 2019-12-09 NOTE — Assessment & Plan Note (Addendum)
Continue atorvastatin 80 mg daily. Last LDL at goal, 38 on 08/07/2018. Continue Aspirin 81 mg daily.

## 2019-12-09 NOTE — Assessment & Plan Note (Signed)
Recent BP mildly elevated. Instructed to monitor BP at home. For now continue carvedilol 12.5 mg twice daily. We discussed possible complications of elevated BP.

## 2019-12-09 NOTE — Progress Notes (Signed)
Virtual Visit via Video Note   I connected with Jared Morse on 12/09/19 by a video enabled telemedicine application and verified that I am speaking with the correct person using two identifiers.  Location patient: Home Location provider:work office Persons participating in the virtual visit: patient, provider  I discussed the limitations of evaluation and management by telemedicine and the availability of in person appointments. The patient expressed understanding and agreed to proceed.   HPI: Jared Morse is a 59 yo male requesting refills on some of his medications. He just moved to [facility].  HTN and CAD, he is on Carvedilol 12.5 mg bid. He is not checking BP regularly. Negative severe/frequent headache, visual changes, chest pain, dyspnea, palpitation, claudication, focal weakness, or edema.  He is not checking BP at home. Recently during eye exam appt BP was 152/82, which is not his usual. He attributes elevated BP to stress during visit. Lab Results  Component Value Date   CREATININE 0.87 08/07/2018   BUN 16 08/07/2018   NA 142 08/07/2018   K 4.3 08/07/2018   CL 106 08/07/2018   CO2 20 08/07/2018   He is on Atorvastatin 80 mg daily.  Lab Results  Component Value Date   CHOL 87 (L) 08/07/2018   HDL 24 (L) 08/07/2018   LDLCALC 38 08/07/2018   TRIG 126 08/07/2018    ROS: See pertinent positives and negatives per HPI.  Past Medical History:  Diagnosis Date  . Allergy   . Asthma   . CAD (coronary artery disease)    EF = 0.40%,mild AR,RVSP=81mmHG  ECHO 08/26/13  . Coronary artery disease    consistent with angina/ NON STEMI (non ST elevated myocardial infaraction)  . Heart disease   . History of chicken pox   . Hypertension   . Myocardial infarction (HCC) 07/2013  . Prediabetes   . Sleep apnea     Past Surgical History:  Procedure Laterality Date  . CORONARY ANGIOPLASTY WITH STENT PLACEMENT  08/06/13   DES to RCA in setting of NSTEMI, 50-60 %LAD    Family  History  Problem Relation Age of Onset  . Hypertension Mother   . Diabetes Mother   . Heart disease Mother   . Hyperlipidemia Mother   . Depression Mother   . Anxiety disorder Mother   . Obesity Mother   . COPD Father   . COPD Sister   . Stroke Sister     Social History   Socioeconomic History  . Marital status: Married    Spouse name: Alba  . Number of children: 2  . Years of education: Not on file  . Highest education level: Not on file  Occupational History  . Not on file  Tobacco Use  . Smoking status: Never Smoker  . Smokeless tobacco: Never Used  Substance and Sexual Activity  . Alcohol use: No  . Drug use: No  . Sexual activity: Yes    Partners: Female  Other Topics Concern  . Not on file  Social History Narrative  . Not on file   Social Determinants of Health   Financial Resource Strain:   . Difficulty of Paying Living Expenses:   Food Insecurity:   . Worried About Programme researcher, broadcasting/film/video in the Last Year:   . Barista in the Last Year:   Transportation Needs:   . Freight forwarder (Medical):   Marland Kitchen Lack of Transportation (Non-Medical):   Physical Activity:   . Days of Exercise per  Week:   . Minutes of Exercise per Session:   Stress:   . Feeling of Stress :   Social Connections:   . Frequency of Communication with Friends and Family:   . Frequency of Social Gatherings with Friends and Family:   . Attends Religious Services:   . Active Member of Clubs or Organizations:   . Attends Archivist Meetings:   Marland Kitchen Marital Status:   Intimate Partner Violence:   . Fear of Current or Ex-Partner:   . Emotionally Abused:   Marland Kitchen Physically Abused:   . Sexually Abused:     Current Outpatient Medications:  .  aspirin 81 MG tablet, Take 81 mg by mouth daily., Disp: , Rfl:  .  atorvastatin (LIPITOR) 80 MG tablet, Take 1 tablet (80 mg total) by mouth daily., Disp: 90 tablet, Rfl: 2 .  carvedilol (COREG) 12.5 MG tablet, Take 1 tablet (12.5 mg total)  by mouth 2 (two) times daily., Disp: 180 tablet, Rfl: 2 .  nitroGLYCERIN (NITROSTAT) 0.4 MG SL tablet, Place 1 tablet (0.4 mg total) under the tongue every 5 (five) minutes as needed for chest pain., Disp: 10 tablet, Rfl: 6 .  Olopatadine HCl (PAZEO) 0.7 % SOLN, Apply 1 drop to eye daily as needed., Disp: 2.5 mL, Rfl: 4 .  Vitamin D, Ergocalciferol, (DRISDOL) 1.25 MG (50000 UT) CAPS capsule, Take 1 capsule (50,000 Units total) by mouth every 7 (seven) days., Disp: 4 capsule, Rfl: 0  EXAM:  VITALS per patient if applicable:There were no vitals taken for this visit.   GENERAL: alert, oriented, appears well and in no acute distress  HEENT: atraumatic, conjunttiva clear, no obvious abnormalities on inspection of external nose and ears  LUNGS: on inspection no signs of respiratory distress, breathing rate appears normal, no obvious gross SOB, gasping or wheezing  CV: no obvious cyanosis  PSYCH/NEURO: pleasant and cooperative, no obvious depression or anxiety, speech and thought processing grossly intact  ASSESSMENT AND PLAN:  Discussed the following assessment and plan:  Benign essential hypertension Recent BP mildly elevated. Instructed to monitor BP at home. For now continue carvedilol 12.5 mg twice daily. We discussed possible complications of elevated BP.  CAD (coronary artery disease) Continue atorvastatin 80 mg daily. Last LDL at goal, 38 on 08/07/2018. Continue Aspirin 81 mg daily.  I discussed the assessment and treatment plan with the patient. Ms Burget was provided an opportunity to ask questions and all were answered. He agreed with the plan and demonstrated an understanding of the instructions.   Return in about 6 months (around 06/10/2020) for HTN,HLD with new PCP.    Seini Lannom Martinique, MD

## 2020-07-26 ENCOUNTER — Other Ambulatory Visit: Payer: Self-pay | Admitting: Family Medicine

## 2020-07-28 NOTE — Telephone Encounter (Signed)
Last office visit- 12/09/2019 Last labs- 08-07-2018-lipid

## 2020-08-06 IMAGING — DX DG CHEST 2V
2 series · 2 of 2 positions shown · non-contrast
Comparison: 11/25/2017.  09/20/2017.  09/11/2017.  CT 09/11/2017.

CLINICAL DATA: History of pneumonia.  Follow-up exam.

EXAM:
CHEST - 2 VIEW

[chest pa]
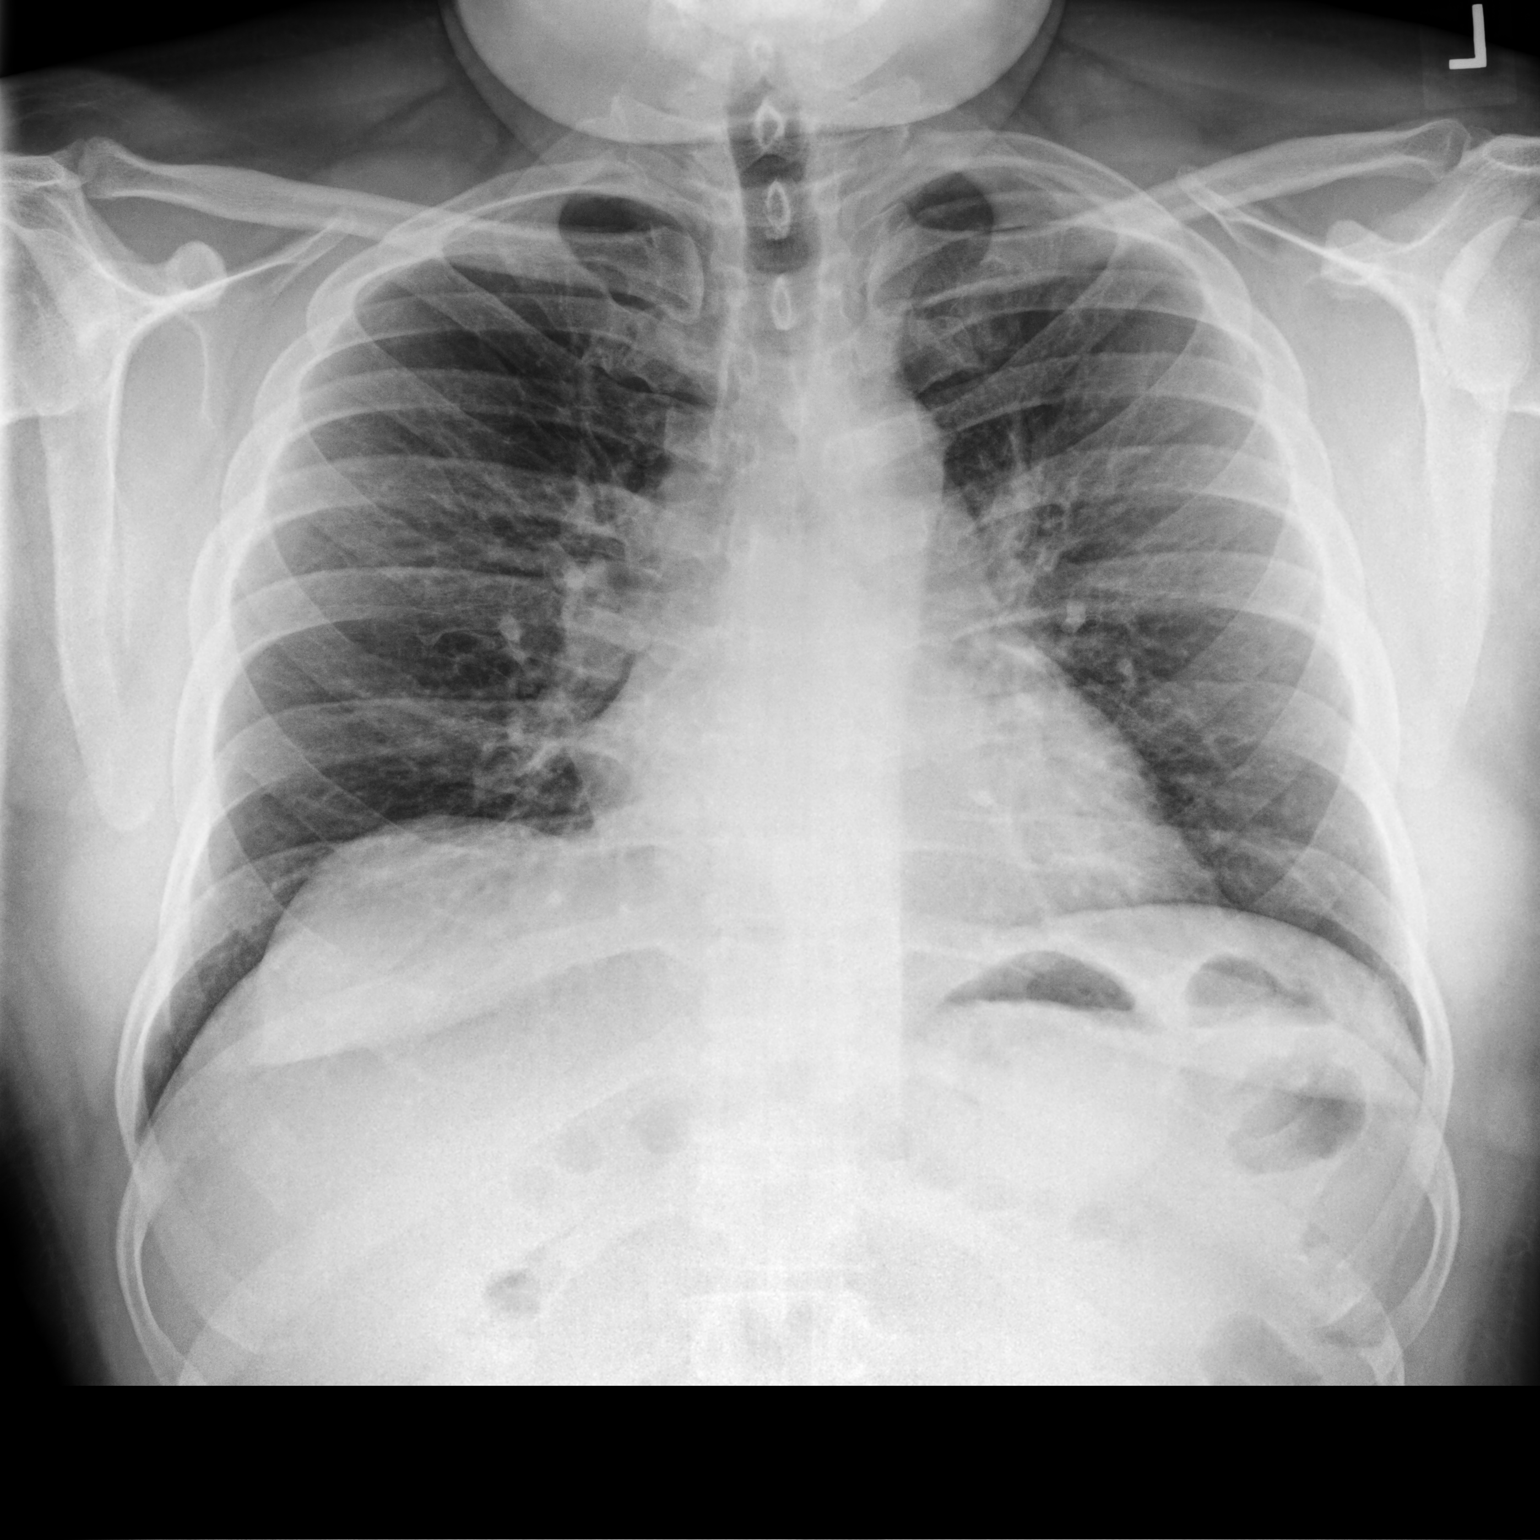

[chest lat]
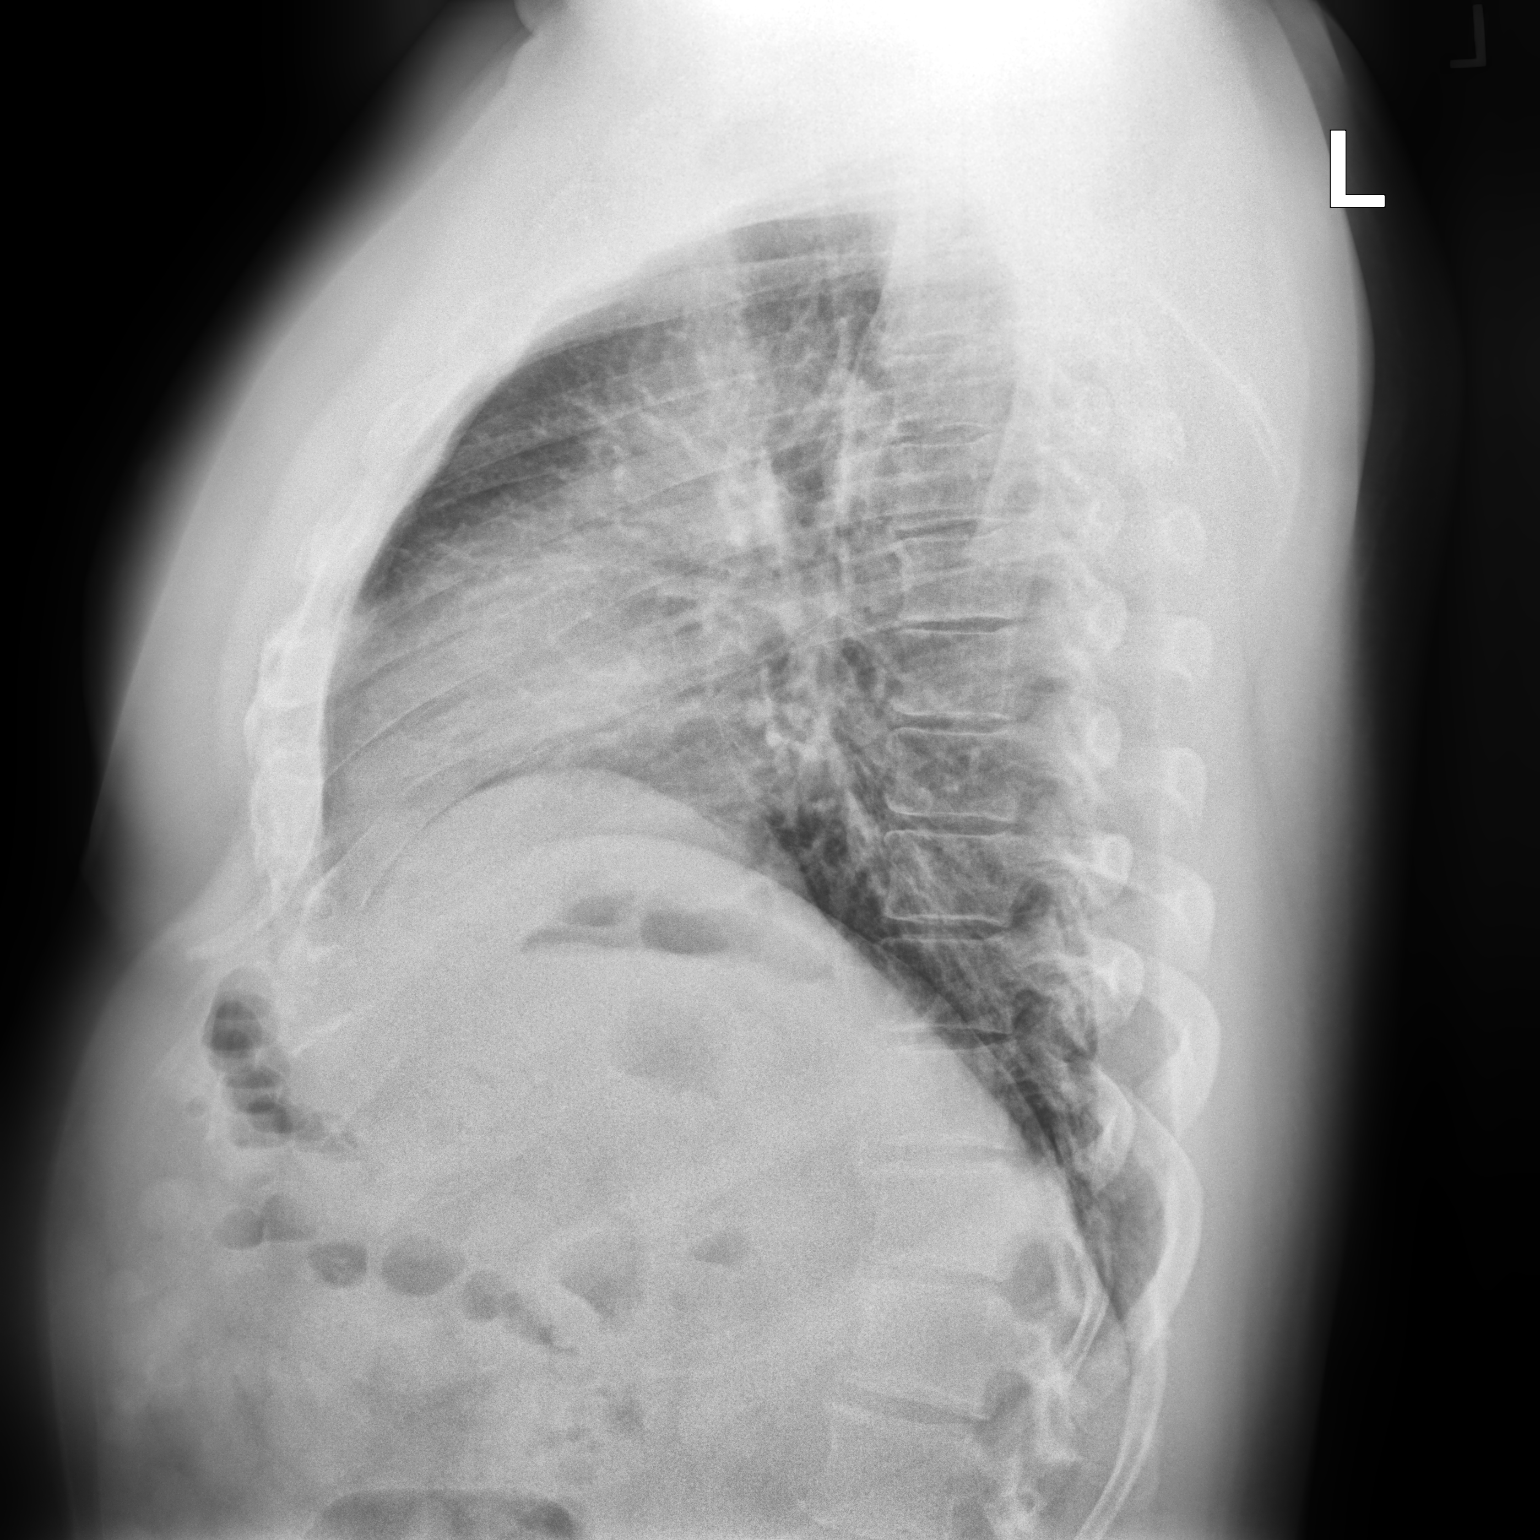

[2 of 2 positions shown; findings below may reference images not displayed]

FINDINGS: Mediastinum hilar structures stable. Heart size stable. Low lung
volumes. Unchanged mild bilateral interstitial prominence noted.
Although these changes may be chronic, active interstitial lung
disease can not be excluded. No evidence of focal alveolar
infiltrate. Degenerative changes thoracolumbar spine. No acute bony
abnormality.
IMPRESSION: Low lung volumes. Unchanged mild bilateral interstitial prominence
noted. Although these changes may be chronic, active interstitial
lung disease can not be excluded. No evidence of focal alveolar
infiltrate. Chest is stable from prior study of 11/25/2017.

## 2020-09-12 ENCOUNTER — Other Ambulatory Visit: Payer: Self-pay | Admitting: Family Medicine

## 2020-11-01 ENCOUNTER — Other Ambulatory Visit: Payer: Self-pay | Admitting: Family Medicine

## 2021-02-02 ENCOUNTER — Other Ambulatory Visit: Payer: Self-pay | Admitting: Family Medicine

## 2021-05-07 ENCOUNTER — Other Ambulatory Visit: Payer: Self-pay | Admitting: Family Medicine

## 2021-06-10 ENCOUNTER — Other Ambulatory Visit: Payer: Self-pay | Admitting: Family Medicine
